# Patient Record
Sex: Female | Born: 1985 | Race: Black or African American | Hispanic: No | Marital: Married | Smoking: Never smoker
Health system: Southern US, Community
[De-identification: ages and names within clinical notes are randomized; demographics above are authoritative.]

## PROBLEM LIST (undated history)

## (undated) ENCOUNTER — Inpatient Hospital Stay (HOSPITAL_COMMUNITY): Payer: Self-pay

## (undated) DIAGNOSIS — J45909 Unspecified asthma, uncomplicated: Secondary | ICD-10-CM

## (undated) HISTORY — PX: CHOLECYSTECTOMY: SHX55

---
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
## 2005-03-16 ENCOUNTER — Emergency Department (HOSPITAL_COMMUNITY): Admission: EM | Admit: 2005-03-16 | Discharge: 2005-03-16 | Payer: Self-pay | Admitting: Emergency Medicine

## 2005-04-29 ENCOUNTER — Emergency Department (HOSPITAL_COMMUNITY): Admission: EM | Admit: 2005-04-29 | Discharge: 2005-04-30 | Payer: Self-pay | Admitting: Emergency Medicine

## 2005-07-20 ENCOUNTER — Emergency Department (HOSPITAL_COMMUNITY): Admission: EM | Admit: 2005-07-20 | Discharge: 2005-07-21 | Payer: Self-pay | Admitting: Emergency Medicine

## 2005-08-06 ENCOUNTER — Emergency Department (HOSPITAL_COMMUNITY): Admission: EM | Admit: 2005-08-06 | Discharge: 2005-08-06 | Payer: Self-pay | Admitting: Emergency Medicine

## 2005-09-05 ENCOUNTER — Emergency Department (HOSPITAL_COMMUNITY): Admission: EM | Admit: 2005-09-05 | Discharge: 2005-09-05 | Payer: Self-pay | Admitting: Emergency Medicine

## 2006-05-21 ENCOUNTER — Emergency Department (HOSPITAL_COMMUNITY): Admission: EM | Admit: 2006-05-21 | Discharge: 2006-05-21 | Payer: Self-pay | Admitting: Emergency Medicine

## 2006-08-05 ENCOUNTER — Emergency Department (HOSPITAL_COMMUNITY): Admission: EM | Admit: 2006-08-05 | Discharge: 2006-08-05 | Payer: Self-pay | Admitting: Emergency Medicine

## 2007-03-03 ENCOUNTER — Emergency Department (HOSPITAL_COMMUNITY): Admission: EM | Admit: 2007-03-03 | Discharge: 2007-03-03 | Payer: Self-pay | Admitting: Emergency Medicine

## 2007-09-02 ENCOUNTER — Emergency Department (HOSPITAL_COMMUNITY): Admission: EM | Admit: 2007-09-02 | Discharge: 2007-09-03 | Payer: Self-pay | Admitting: Emergency Medicine

## 2008-02-16 ENCOUNTER — Emergency Department (HOSPITAL_COMMUNITY): Admission: EM | Admit: 2008-02-16 | Discharge: 2008-02-16 | Payer: Self-pay | Admitting: Emergency Medicine

## 2008-08-29 ENCOUNTER — Emergency Department (HOSPITAL_COMMUNITY): Admission: EM | Admit: 2008-08-29 | Discharge: 2008-08-30 | Payer: Self-pay | Admitting: Emergency Medicine

## 2009-04-16 ENCOUNTER — Emergency Department (HOSPITAL_COMMUNITY): Admission: EM | Admit: 2009-04-16 | Discharge: 2009-04-16 | Payer: Self-pay | Admitting: Emergency Medicine

## 2009-04-23 ENCOUNTER — Emergency Department (HOSPITAL_COMMUNITY): Admission: EM | Admit: 2009-04-23 | Discharge: 2009-04-24 | Payer: Self-pay | Admitting: Emergency Medicine

## 2009-07-24 ENCOUNTER — Emergency Department (HOSPITAL_COMMUNITY): Admission: EM | Admit: 2009-07-24 | Discharge: 2009-07-24 | Payer: Self-pay | Admitting: Emergency Medicine

## 2009-07-25 ENCOUNTER — Emergency Department (HOSPITAL_COMMUNITY): Admission: EM | Admit: 2009-07-25 | Discharge: 2009-07-25 | Payer: Self-pay | Admitting: Emergency Medicine

## 2009-09-03 ENCOUNTER — Emergency Department (HOSPITAL_COMMUNITY): Admission: EM | Admit: 2009-09-03 | Discharge: 2009-09-03 | Payer: Self-pay | Admitting: Emergency Medicine

## 2010-09-04 LAB — URINALYSIS, ROUTINE W REFLEX MICROSCOPIC
Bilirubin Urine: NEGATIVE
Hgb urine dipstick: NEGATIVE
Ketones, ur: NEGATIVE mg/dL
Nitrite: NEGATIVE
Specific Gravity, Urine: 1.014 (ref 1.005–1.030)
Urobilinogen, UA: 1 mg/dL (ref 0.0–1.0)

## 2010-09-04 LAB — DIFFERENTIAL
Basophils Absolute: 0 10*3/uL (ref 0.0–0.1)
Basophils Relative: 0 % (ref 0–1)
Eosinophils Absolute: 0.4 10*3/uL (ref 0.0–0.7)
Lymphocytes Relative: 37 % (ref 12–46)
Lymphs Abs: 3.6 10*3/uL (ref 0.7–4.0)
Neutro Abs: 4.9 10*3/uL (ref 1.7–7.7)
Neutrophils Relative %: 50 % (ref 43–77)

## 2010-09-04 LAB — BASIC METABOLIC PANEL
CO2: 27 mEq/L (ref 19–32)
Creatinine, Ser: 0.72 mg/dL (ref 0.4–1.2)
GFR calc Af Amer: >60 mL/min (ref >60)
Glucose, Bld: 92 mg/dL (ref 70–99)
Potassium: 3.4 mEq/L — ABNORMAL LOW (ref 3.5–5.1)
Sodium: 135 mEq/L (ref 135–145)

## 2010-09-04 LAB — CBC
HCT: 35.2 % — ABNORMAL LOW (ref 36.0–46.0)
MCHC: 33.9 g/dL (ref 30.0–36.0)

## 2010-09-05 LAB — DIFFERENTIAL
Basophils Absolute: 0 10*3/uL (ref 0.0–0.1)
Eosinophils Relative: 4 % (ref 0–5)
Monocytes Relative: 7 % (ref 3–12)
Neutrophils Relative %: 57 % (ref 43–77)

## 2010-09-05 LAB — CBC
Hemoglobin: 12.1 g/dL (ref 12.0–15.0)
MCHC: 33.8 g/dL (ref 30.0–36.0)
Platelets: 339 10*3/uL (ref 150–400)
RBC: 4.02 MIL/uL (ref 3.87–5.11)
RDW: 13 % (ref 11.5–15.5)

## 2010-09-08 LAB — DIFFERENTIAL
Basophils Relative: 1 % (ref 0–1)
Eosinophils Absolute: 0.3 10*3/uL (ref 0.0–0.7)
Lymphocytes Relative: 22 % (ref 12–46)
Monocytes Absolute: 0.8 10*3/uL (ref 0.1–1.0)
Monocytes Relative: 9 % (ref 3–12)
Neutrophils Relative %: 65 % (ref 43–77)

## 2010-09-08 LAB — CBC: HCT: 39.6 % (ref 36.0–46.0)

## 2010-09-08 LAB — URINALYSIS, ROUTINE W REFLEX MICROSCOPIC
Glucose, UA: NEGATIVE mg/dL
Ketones, ur: NEGATIVE mg/dL
Protein, ur: NEGATIVE mg/dL
Specific Gravity, Urine: 1.019 (ref 1.005–1.030)
pH: 6 (ref 5.0–8.0)

## 2010-09-08 LAB — COMPREHENSIVE METABOLIC PANEL
ALT: 31 U/L (ref 0–35)
Albumin: 3.9 g/dL (ref 3.5–5.2)
Alkaline Phosphatase: 93 U/L (ref 39–117)
BUN: 3 mg/dL — ABNORMAL LOW (ref 6–23)
Chloride: 103 mEq/L (ref 96–112)
GFR calc Af Amer: >60 mL/min (ref >60)
GFR calc non Af Amer: >60 mL/min (ref >60)
Glucose, Bld: 83 mg/dL (ref 70–99)
Potassium: 3.9 mEq/L (ref 3.5–5.1)
Total Protein: 7.7 g/dL (ref 6.0–8.3)

## 2010-09-08 LAB — URINE MICROSCOPIC-ADD ON

## 2010-09-08 LAB — PREGNANCY, URINE: Preg Test, Ur: POSITIVE

## 2010-09-18 LAB — DIFFERENTIAL
Eosinophils Absolute: 0.2 10*3/uL (ref 0.0–0.7)
Lymphocytes Relative: 35 % (ref 12–46)
Lymphs Abs: 3.5 10*3/uL (ref 0.7–4.0)
Neutro Abs: 5.6 10*3/uL (ref 1.7–7.7)
Neutrophils Relative %: 56 % (ref 43–77)

## 2010-09-18 LAB — WET PREP, GENITAL
Clue Cells Wet Prep HPF POC: NONE SEEN
Trich, Wet Prep: NONE SEEN
Yeast Wet Prep HPF POC: NONE SEEN

## 2010-09-18 LAB — CBC
Platelets: 383 10*3/uL (ref 150–400)
RBC: 3.98 MIL/uL (ref 3.87–5.11)
WBC: 9.9 10*3/uL (ref 4.0–10.5)

## 2010-09-18 LAB — URINE MICROSCOPIC-ADD ON

## 2010-09-18 LAB — URINALYSIS, ROUTINE W REFLEX MICROSCOPIC
Glucose, UA: NEGATIVE mg/dL
Leukocytes, UA: NEGATIVE
Protein, ur: NEGATIVE mg/dL
Urobilinogen, UA: 1 mg/dL (ref 0.0–1.0)

## 2010-09-18 LAB — BASIC METABOLIC PANEL
BUN: 5 mg/dL — ABNORMAL LOW (ref 6–23)
CO2: 25 mEq/L (ref 19–32)
Calcium: 9.4 mg/dL (ref 8.4–10.5)
GFR calc non Af Amer: >60 mL/min (ref >60)
Glucose, Bld: 100 mg/dL — ABNORMAL HIGH (ref 70–99)

## 2010-09-18 LAB — GC/CHLAMYDIA PROBE AMP, GENITAL: GC Probe Amp, Genital: NEGATIVE

## 2011-02-01 IMAGING — US US TRANSVAGINAL NON-OB
1 series · 13 of 25 positions shown · non-contrast
Comparison: CT abdomen and pelvis 03/03/2007.

CLINICAL DATA: 23-year-old female with pelvic pain on the right
with vomiting.

TRANSABDOMINAL AND TRANSVAGINAL ULTRASOUND OF PELVIS
DOPPLER ULTRASOUND OF OVARIES
TECHNIQUE: Both transabdominal and transvaginal ultrasound
examinations of the pelvis were performed including evaluation of
the uterus, ovaries, adnexal regions, and pelvic cul-de-sac. Color
and duplex Doppler ultrasound was utilized to evaluate blood flow
to the ovaries.

[Series 1: us transvaginal non-ob · 0.28mm/px · 13 of 40 slices shown]
[im 1/40]
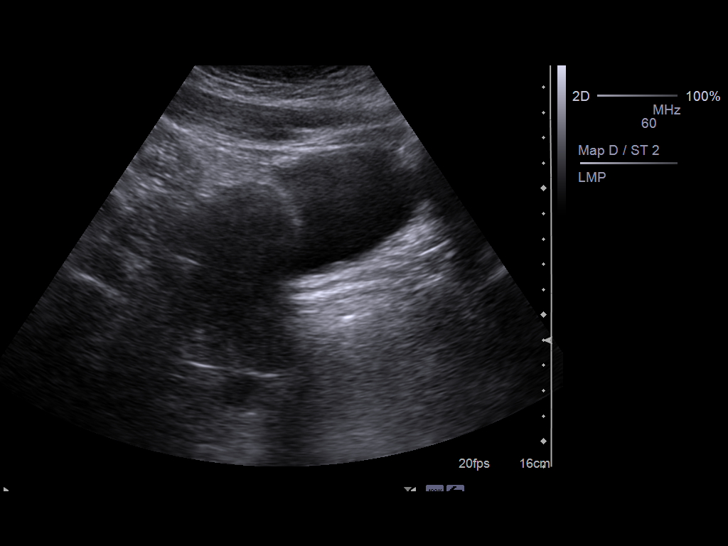
[im 4/40]
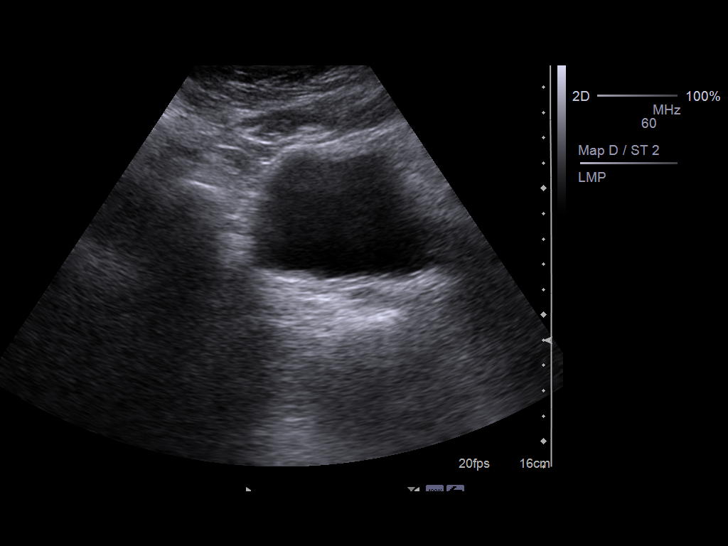
[im 7/40]
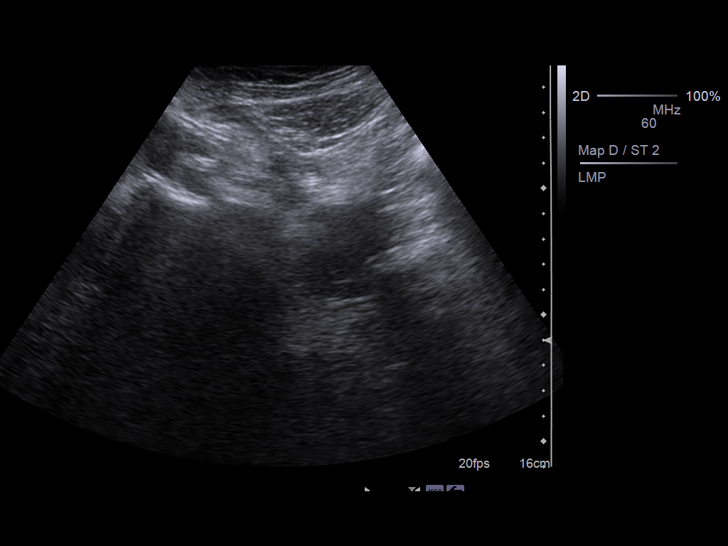
[im 10/40]
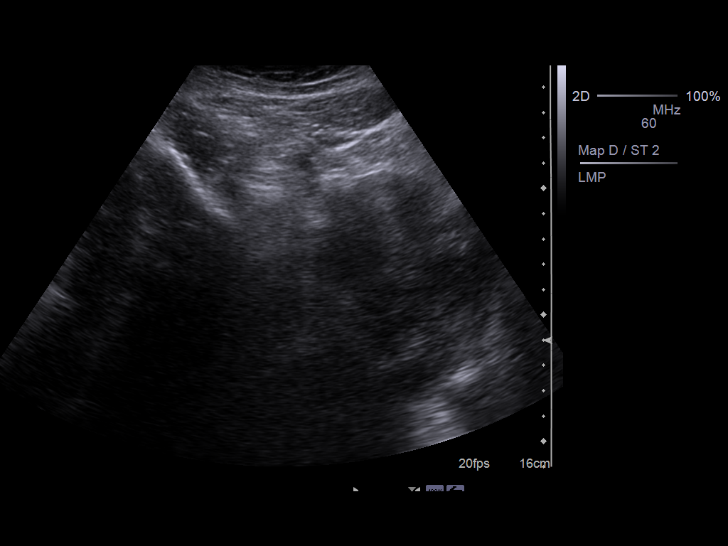
[im 14/40]
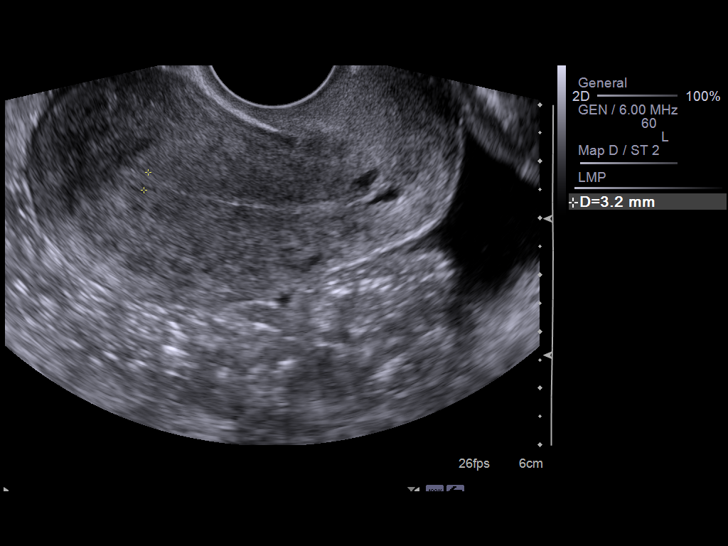
[im 17/40]
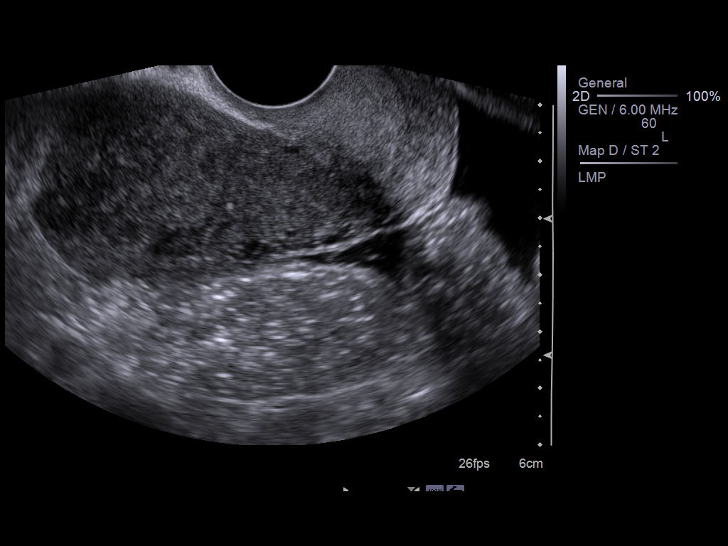
[im 20/40]
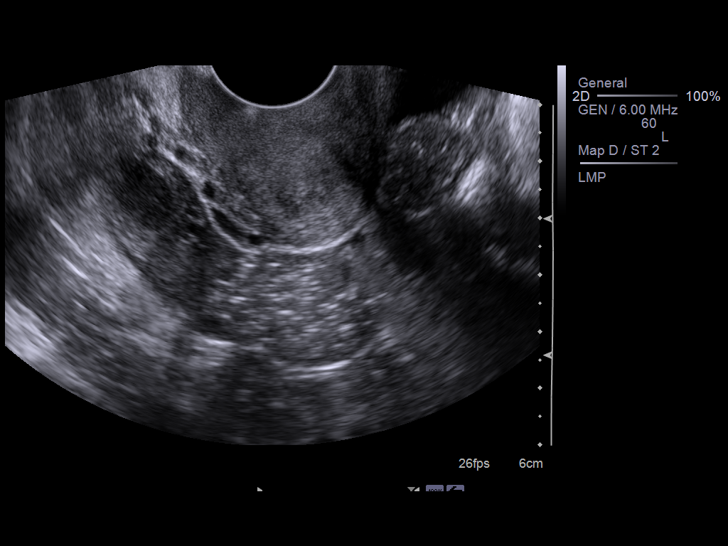
[im 23/40]
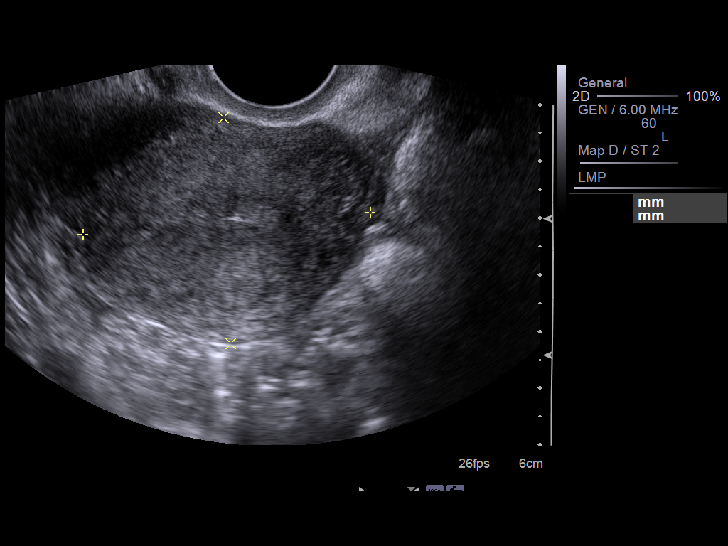
[im 27/40]
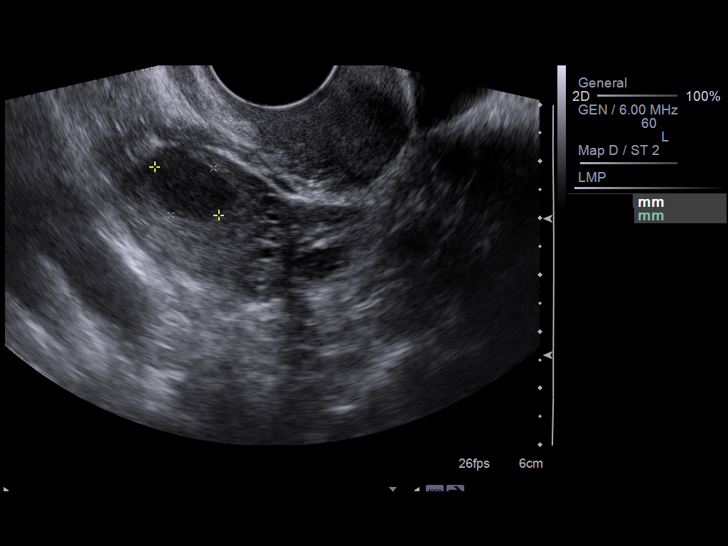
[im 30/40]
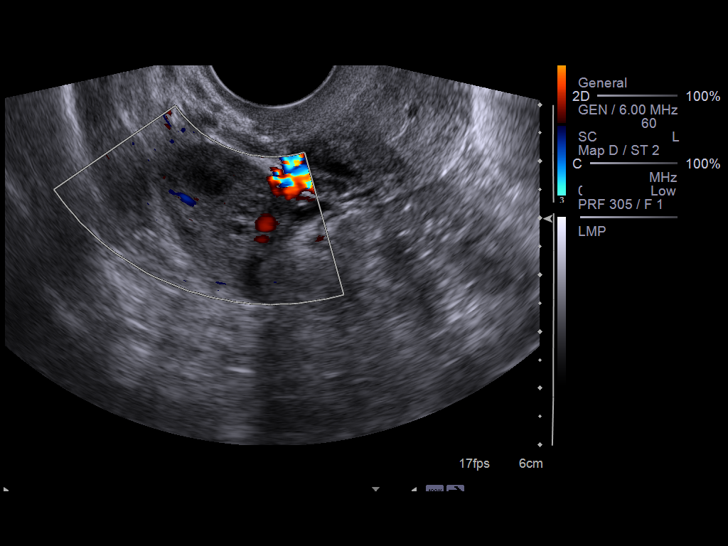
[im 33/40]
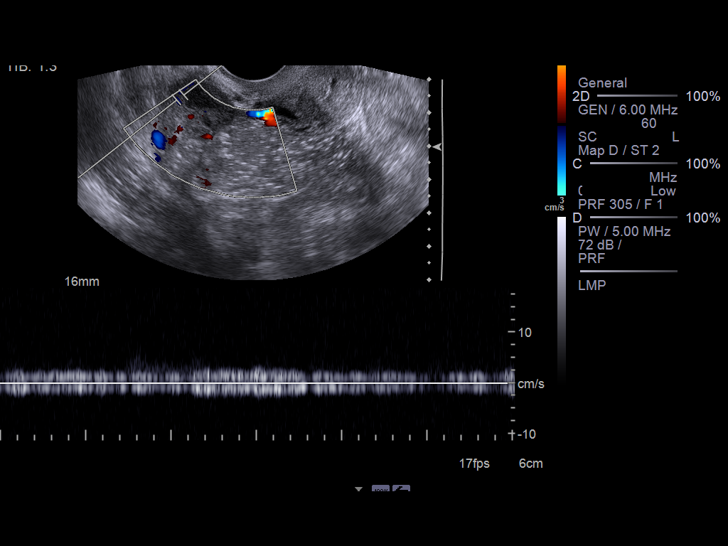
[im 36/40]
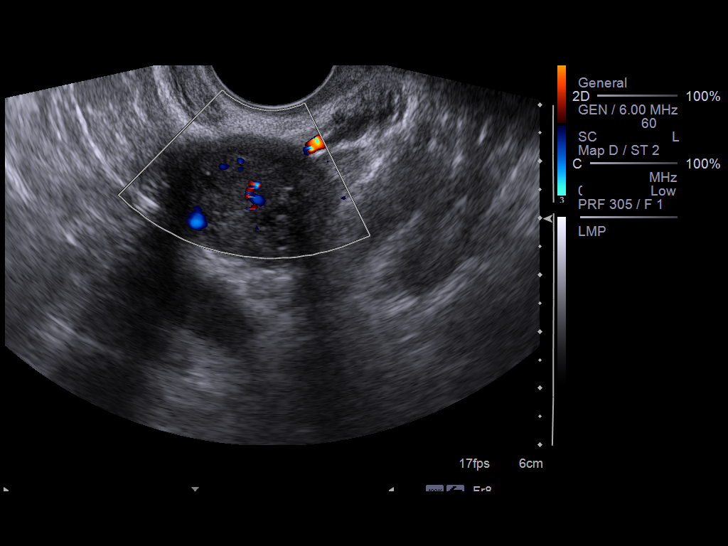
[im 40/40]
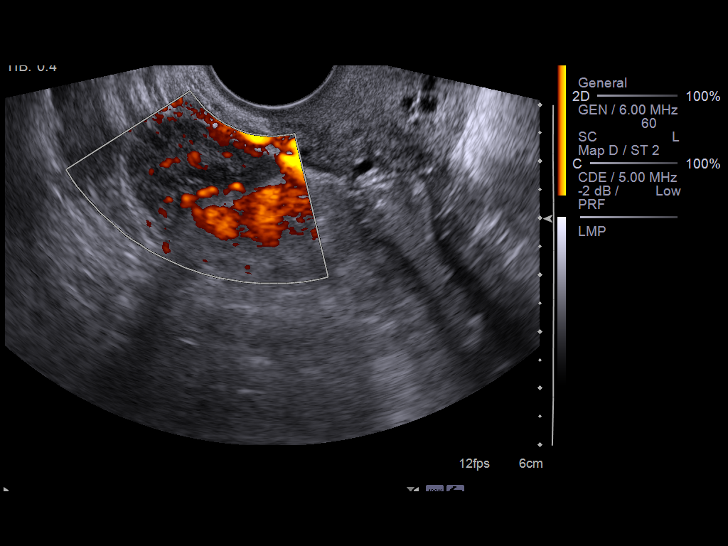

[13 of 25 positions shown; findings below may reference images not displayed]

FINDINGS: Uterus is within normal limits measuring is within normal limits
measuring 7.8 x 4.0 x 5.1 cm.

Endometrium 3 mm and is within normal limits.

Right Ovary is remarkable for a 14 mm hypoechoic lesion .  Overall
right adnexal size is 3.8 x 2.3 x 1.2 cm.  Arterial and venous flow
is demonstrated with spectral Doppler.

Left Ovary is within normal limits measuring 3.0 x 2.3 x 2.1 cm.
There is arterial and venous flow demonstrated with spectral
Doppler.

Other Findings:  Moderate volume of simple appearing pelvic free
fluid.
IMPRESSION: 1.  No evidence of ovarian torsion.
2.  14 mm hypoechoic lesion in the right ovary.  Favor physiologic
cyst.  Recommend correlation with pregnancy test.
3.  Moderate volume of simple appearing pelvic free fluid.

## 2011-03-10 LAB — CBC
HCT: 39.3
Hemoglobin: 13.4
MCHC: 34
MCV: 90
Platelets: 387
RBC: 4.36
RDW: 12.6
WBC: 10.2

## 2011-03-10 LAB — BASIC METABOLIC PANEL WITH GFR
Chloride: 105
Creatinine, Ser: 0.77
GFR calc Af Amer: >60
GFR calc non Af Amer: >60
Potassium: 3.8

## 2011-03-10 LAB — URINALYSIS, ROUTINE W REFLEX MICROSCOPIC
Bilirubin Urine: NEGATIVE
Glucose, UA: NEGATIVE
Ketones, ur: NEGATIVE
Nitrite: NEGATIVE
Protein, ur: NEGATIVE
Specific Gravity, Urine: 1.019
Urobilinogen, UA: 1
pH: 6.5

## 2011-03-10 LAB — URINE MICROSCOPIC-ADD ON

## 2011-03-10 LAB — BASIC METABOLIC PANEL
BUN: 4 — ABNORMAL LOW
CO2: 23
Calcium: 9.5
Glucose, Bld: 108 — ABNORMAL HIGH
Sodium: 137

## 2011-03-10 LAB — DIFFERENTIAL
Basophils Absolute: 0
Basophils Relative: 0
Eosinophils Absolute: 0.2
Eosinophils Relative: 2
Lymphocytes Relative: 30
Lymphs Abs: 3.1
Monocytes Absolute: 0.4
Monocytes Relative: 4
Neutro Abs: 6.5
Neutrophils Relative %: 63

## 2011-03-10 LAB — OCCULT BLOOD X 1 CARD TO LAB, STOOL: Fecal Occult Bld: NEGATIVE

## 2011-03-10 LAB — PREGNANCY, URINE: Preg Test, Ur: NEGATIVE

## 2011-03-19 LAB — URINALYSIS, ROUTINE W REFLEX MICROSCOPIC
Ketones, ur: NEGATIVE
Nitrite: NEGATIVE
Protein, ur: NEGATIVE

## 2011-03-19 LAB — POCT PREGNANCY, URINE: Preg Test, Ur: NEGATIVE

## 2011-03-19 LAB — RAPID STREP SCREEN (MED CTR MEBANE ONLY): Streptococcus, Group A Screen (Direct): NEGATIVE

## 2011-03-19 LAB — URINE MICROSCOPIC-ADD ON

## 2011-03-27 LAB — CBC
HCT: 39.5
Hemoglobin: 13.6
MCHC: 34.5
MCV: 88.5
Platelets: 403 — ABNORMAL HIGH
RBC: 4.46
RDW: 12.8
WBC: 9.1

## 2011-03-27 LAB — URINALYSIS, ROUTINE W REFLEX MICROSCOPIC
Bilirubin Urine: NEGATIVE
Nitrite: NEGATIVE
Specific Gravity, Urine: 1.01
pH: 7.5

## 2011-03-27 LAB — URINE MICROSCOPIC-ADD ON

## 2011-03-27 LAB — DIFFERENTIAL
Basophils Absolute: 0.1
Basophils Relative: 1
Eosinophils Absolute: 0.3
Eosinophils Relative: 3
Lymphocytes Relative: 37
Lymphs Abs: 3.4 — ABNORMAL HIGH
Monocytes Absolute: 0.6
Monocytes Relative: 6
Neutro Abs: 4.8
Neutrophils Relative %: 53

## 2011-03-27 LAB — WET PREP, GENITAL
Trich, Wet Prep: NONE SEEN
Yeast Wet Prep HPF POC: NONE SEEN

## 2011-03-27 LAB — I-STAT 8, (EC8 V) (CONVERTED LAB)
Acid-base deficit: 2
BUN: 6
Bicarbonate: 24.3 — ABNORMAL HIGH
Chloride: 107
Glucose, Bld: 135 — ABNORMAL HIGH
HCT: 45
Hemoglobin: 15.3 — ABNORMAL HIGH
Operator id: 234501
Potassium: 3.6
Sodium: 143
TCO2: 26
pCO2, Ven: 48.3
pH, Ven: 7.311 — ABNORMAL HIGH

## 2011-03-27 LAB — GC/CHLAMYDIA PROBE AMP, GENITAL
Chlamydia, DNA Probe: NEGATIVE
GC Probe Amp, Genital: NEGATIVE

## 2011-03-27 LAB — SYPHILIS: RPR W/REFLEX TO RPR TITER AND TREPONEMAL ANTIBODIES, TRADITIONAL SCREENING AND DIAGNOSIS ALGORITHM: RPR Ser Ql: NONREACTIVE

## 2011-03-27 LAB — POCT I-STAT CREATININE
Creatinine, Ser: 0.9
Operator id: 234501

## 2013-05-18 ENCOUNTER — Emergency Department (HOSPITAL_COMMUNITY)
Admission: EM | Admit: 2013-05-18 | Discharge: 2013-05-18 | Disposition: A | Discharge status: Home / Self Care | Payer: Self-pay | Attending: Emergency Medicine | Admitting: Emergency Medicine

## 2013-05-18 DIAGNOSIS — J040 Acute laryngitis: Secondary | ICD-10-CM | POA: Insufficient documentation

## 2013-05-18 DIAGNOSIS — Z880 Allergy status to penicillin: Secondary | ICD-10-CM | POA: Insufficient documentation

## 2013-05-18 DIAGNOSIS — J9801 Acute bronchospasm: Secondary | ICD-10-CM

## 2013-05-18 DIAGNOSIS — Z79899 Other long term (current) drug therapy: Secondary | ICD-10-CM | POA: Insufficient documentation

## 2013-05-18 DIAGNOSIS — M2550 Pain in unspecified joint: Secondary | ICD-10-CM | POA: Insufficient documentation

## 2013-05-18 DIAGNOSIS — J029 Acute pharyngitis, unspecified: Secondary | ICD-10-CM | POA: Insufficient documentation

## 2013-05-18 DIAGNOSIS — J45901 Unspecified asthma with (acute) exacerbation: Secondary | ICD-10-CM | POA: Insufficient documentation

## 2013-05-18 DIAGNOSIS — IMO0001 Reserved for inherently not codable concepts without codable children: Secondary | ICD-10-CM | POA: Insufficient documentation

## 2013-05-18 LAB — RAPID STREP SCREEN (MED CTR MEBANE ONLY): Streptococcus, Group A Screen (Direct): NEGATIVE

## 2013-05-18 MED ORDER — HYDROCOD POLST-CHLORPHEN POLST 10-8 MG/5ML PO LQCR: 5.0000 mL | Freq: Two times a day (BID) | ORAL | Status: DC | PRN

## 2013-05-18 MED ORDER — ALBUTEROL SULFATE (5 MG/ML) 0.5% IN NEBU
5.0000 mg | INHALATION_SOLUTION | Freq: Once | RESPIRATORY_TRACT | Status: AC
  Administered 2013-05-18: 5 mg via RESPIRATORY_TRACT
  Filled 2013-05-18: qty 1

## 2013-05-18 MED ORDER — PREDNISONE 20 MG PO TABS
60.0000 mg | ORAL_TABLET | Freq: Once | ORAL | Status: AC
  Administered 2013-05-18: 60 mg via ORAL
  Filled 2013-05-18: qty 3

## 2013-05-18 NOTE — ED Notes (Signed)
Patient having sore throat last several days rapid strep was obtain by PA

## 2013-05-18 NOTE — ED Provider Notes (Signed)
CSN: [id number]     Arrival date & time 05/18/13  4098 History   First MD Initiated Contact with Patient 05/18/13 [phone]     Chief Complaint  Patient presents with  . Sore Throat   (Consider location/radiation/quality/duration/timing/severity/associated sxs/prior Treatment) HPI Comments: Pt is a 27 y/o female with a PMHx of asthma who presents to the ED complaining of sore throat and generalized body aches x 1 day and intermittent both dry and productive cough with green and blood-tinged sputum x 2 weeks. She gets a pain in the center of her chest when she coughs. She began to lose her voice yesterday, has been slightly wheezing. Denies fever. Tried OTC cold medications and inhaler without relief. Her daughter is sick with strep throat.   The history is provided by the patient.    No past medical history on file. No past surgical history on file. No family history on file. History  Substance Use Topics  . Smoking status: Not on file  . Smokeless tobacco: Not on file  . Alcohol Use: Not on file   OB History   No data available     Review of Systems  HENT: Positive for sore throat and voice change.   Respiratory: Positive for cough, chest tightness and wheezing.   Musculoskeletal: Positive for arthralgias and myalgias.  All other systems reviewed and are negative.    Allergies  Penicillins and Tomato  Home Medications   Current Outpatient Rx  Name  Route  Sig  Dispense  Refill  . albuterol (PROVENTIL HFA;VENTOLIN HFA) 108 (90 BASE) MCG/ACT inhaler   Inhalation   Inhale 1-2 puffs into the lungs every 6 (six) hours as needed for wheezing or shortness of breath.         . chlorpheniramine-HYDROcodone (TUSSIONEX PENNKINETIC ER) 10-8 MG/5ML LQCR   Oral   Take 5 mLs by mouth every 12 (twelve) hours as needed for cough.   115 mL   0    BP 129/87  Pulse 69  SpO2 100% Physical Exam  Nursing note and vitals reviewed. Constitutional: She is oriented to person, place, and  time. She appears well-developed and well-nourished. No distress.  HENT:  Head: Normocephalic and atraumatic.  Mouth/Throat: Uvula is midline and mucous membranes are normal. Posterior oropharyngeal erythema present. No oropharyngeal exudate or posterior oropharyngeal edema.  Hoarse voice.  Eyes: Conjunctivae are normal.  Neck: Normal range of motion. Neck supple.  Cardiovascular: Normal rate, regular rhythm and normal heart sounds.   Pulmonary/Chest: Effort normal. No respiratory distress. She has no decreased breath sounds. She has wheezes (mild scattered end-expiratory). She has no rhonchi. She has no rales.  Abdominal: Soft. Bowel sounds are normal. There is no tenderness.  Musculoskeletal: Normal range of motion. She exhibits no edema.  Lymphadenopathy:    She has cervical adenopathy.  Neurological: She is alert and oriented to person, place, and time.  Skin: Skin is warm and dry. She is not diaphoretic.  Psychiatric: She has a normal mood and affect. Her behavior is normal.    ED Course  Procedures (including critical care time) Labs Review Labs Reviewed  RAPID STREP SCREEN  CULTURE, GROUP A STREP   Imaging Review No results found.  EKG Interpretation   None       MDM   1. Bronchospasm   2. Pharyngitis   3. Laryngitis    Patient presenting with sore throat, cough and wheezing. Albuterol nebulizer treatment given, patient reports some improvement of her symptoms, clinical improvement  noted on reexamination. Still with some mild expiratory wheezes. 60 mg prednisone given. Rapid strep negative. Afebrile, normal vital signs. Will discharge with Tussionex for cough. She has inhalers at home. Return precautions given. Patient states understanding of plan and is agreeable.   Trevor Mace, PA-C 05/18/13 [phone]

## 2013-05-20 LAB — CULTURE, GROUP A STREP

## 2013-05-21 NOTE — ED Provider Notes (Signed)
Medical screening examination/treatment/procedure(s) were performed by non-physician practitioner and as supervising physician I was immediately available for consultation/collaboration.   Chanel Mckesson, MD 05/21/13 0558 

## 2013-05-23 ENCOUNTER — Emergency Department (HOSPITAL_COMMUNITY)
Admission: EM | Admit: 2013-05-23 | Discharge: 2013-05-23 | Disposition: A | Discharge status: Home / Self Care | Payer: Self-pay | Attending: Emergency Medicine | Admitting: Emergency Medicine

## 2013-05-23 ENCOUNTER — Encounter (HOSPITAL_COMMUNITY): Payer: Self-pay | Admitting: Emergency Medicine

## 2013-05-23 DIAGNOSIS — Z79899 Other long term (current) drug therapy: Secondary | ICD-10-CM | POA: Insufficient documentation

## 2013-05-23 DIAGNOSIS — R1110 Vomiting, unspecified: Secondary | ICD-10-CM | POA: Insufficient documentation

## 2013-05-23 DIAGNOSIS — J40 Bronchitis, not specified as acute or chronic: Secondary | ICD-10-CM

## 2013-05-23 DIAGNOSIS — J029 Acute pharyngitis, unspecified: Secondary | ICD-10-CM | POA: Insufficient documentation

## 2013-05-23 DIAGNOSIS — J209 Acute bronchitis, unspecified: Secondary | ICD-10-CM | POA: Insufficient documentation

## 2013-05-23 DIAGNOSIS — J45901 Unspecified asthma with (acute) exacerbation: Secondary | ICD-10-CM | POA: Insufficient documentation

## 2013-05-23 DIAGNOSIS — R42 Dizziness and giddiness: Secondary | ICD-10-CM | POA: Insufficient documentation

## 2013-05-23 DIAGNOSIS — Z880 Allergy status to penicillin: Secondary | ICD-10-CM | POA: Insufficient documentation

## 2013-05-23 HISTORY — DX: Unspecified asthma, uncomplicated: J45.909

## 2013-05-23 MED ORDER — AZITHROMYCIN 250 MG PO TABS
500.0000 mg | ORAL_TABLET | Freq: Once | ORAL | Status: AC
  Administered 2013-05-23: 500 mg via ORAL
  Filled 2013-05-23: qty 2

## 2013-05-23 MED ORDER — AZITHROMYCIN 250 MG PO TABS: 250.0000 mg | ORAL_TABLET | Freq: Every day | ORAL | Status: DC

## 2013-05-23 MED ORDER — ALBUTEROL SULFATE (5 MG/ML) 0.5% IN NEBU
5.0000 mg | INHALATION_SOLUTION | Freq: Once | RESPIRATORY_TRACT | Status: AC
  Administered 2013-05-23: 5 mg via RESPIRATORY_TRACT
  Filled 2013-05-23: qty 1

## 2013-05-23 MED ORDER — ALBUTEROL SULFATE HFA 108 (90 BASE) MCG/ACT IN AERS
2.0000 | INHALATION_SPRAY | RESPIRATORY_TRACT | Status: DC | PRN
  Administered 2013-05-23: 2 via RESPIRATORY_TRACT
  Filled 2013-05-23: qty 6.7

## 2013-05-23 MED ORDER — HYDROCOD POLST-CHLORPHEN POLST 10-8 MG/5ML PO LQCR: 5.0000 mL | Freq: Two times a day (BID) | ORAL | Status: DC | PRN

## 2013-05-23 NOTE — ED Notes (Signed)
Pt does have some wheezing on auscultation

## 2013-05-23 NOTE — ED Notes (Addendum)
Pt states she has been having fever, cough, and sore throat since last week. Pt was seen here for the same last Wednesday. Pt states she has greenish colored sputum when she coughs.

## 2013-05-23 NOTE — ED Provider Notes (Signed)
CSN: [id number]     Arrival date & time 05/23/13  1229 History   First MD Initiated Contact with Patient 05/23/13 1415     Chief Complaint  Patient presents with  . Sore Throat  . Cough  . Dizziness  . Emesis  . Fever   (Consider location/radiation/quality/duration/timing/severity/associated sxs/prior Treatment) Patient is a 27 y.o. female presenting with pharyngitis, cough, vomiting, and fever. The history is provided by the patient. No language interpreter was used.  Sore Throat This is a new problem. The current episode started in the past 7 days. Associated symptoms include congestion, coughing, a fever, a sore throat and vomiting. Pertinent negatives include no abdominal pain or nausea. Associated symptoms comments: She complains of a sore throat, chest congestion and and chest tightness. She has had a subjective fever. No nausea, vomiting or abdominal pain. She reports she has been exposed to a family member with strep throat. She was seen in the emergency department 5 days ago with similar symptoms and presents today for persistent, not worsening symptoms. .  Cough Associated symptoms: fever, shortness of breath and sore throat   Emesis Associated symptoms: sore throat   Associated symptoms: no abdominal pain   Fever Associated symptoms: congestion, cough, sore throat and vomiting   Associated symptoms: no nausea     Past Medical History  Diagnosis Date  . Asthma    Past Surgical History  Procedure Laterality Date  . Cholecystectomy     No family history on file. History  Substance Use Topics  . Smoking status: Never Smoker   . Smokeless tobacco: Not on file  . Alcohol Use: No   OB History   Grav Para Term Preterm Abortions TAB SAB Ect Mult Living                 Review of Systems  Constitutional: Positive for fever.  HENT: Positive for congestion, sore throat and voice change. Negative for trouble swallowing.   Respiratory: Positive for cough, chest tightness  and shortness of breath.   Cardiovascular: Negative for leg swelling.  Gastrointestinal: Positive for vomiting. Negative for nausea and abdominal pain.  Genitourinary: Negative.   Neurological: Positive for dizziness.    Allergies  Penicillins and Tomato  Home Medications   Current Outpatient Rx  Name  Route  Sig  Dispense  Refill  . albuterol (PROVENTIL HFA;VENTOLIN HFA) 108 (90 BASE) MCG/ACT inhaler   Inhalation   Inhale 1-2 puffs into the lungs every 6 (six) hours as needed for wheezing or shortness of breath.         . chlorpheniramine-HYDROcodone (TUSSIONEX PENNKINETIC ER) 10-8 MG/5ML LQCR   Oral   Take 5 mLs by mouth every 12 (twelve) hours as needed for cough.   115 mL   0    BP 121/66  Pulse 107  Temp(Src) 98 F (36.7 C) (Oral)  Resp 20  Wt 257 lb (116.574 kg)  SpO2 97%  LMP 05/17/2013 Physical Exam  Constitutional: She is oriented to person, place, and time. She appears well-developed and well-nourished.  HENT:  Head: Normocephalic.  Nose: Right sinus exhibits no frontal sinus tenderness. Left sinus exhibits no frontal sinus tenderness.  Mouth/Throat: Uvula is midline. Mucous membranes are not dry. Posterior oropharyngeal erythema present.  Eyes: Conjunctivae are normal.  Neck: Normal range of motion. Neck supple.  Cardiovascular: Normal rate and regular rhythm.   Pulmonary/Chest: Effort normal and breath sounds normal. She has no wheezes. She has no rales.  Abdominal: Soft. Bowel sounds  are normal. There is no tenderness. There is no rebound and no guarding.  Musculoskeletal: Normal range of motion.  Neurological: She is alert and oriented to person, place, and time.  Skin: Skin is warm and dry. No rash noted.  Psychiatric: She has a normal mood and affect.    ED Course  Procedures (including critical care time) Labs Review Labs Reviewed - No data to display Imaging Review No results found.  EKG Interpretation   None       MDM  No diagnosis  found. 1. Bronchitis  Will treat with Zithromax secondary to duration of symptoms, repeated ER visits. She is stable here, reports her chest tightness is like her asthma for which she would use an inhaler if she had one. Minimal risk factors for PE with symptoms to explain very mild tachycardia.     Arnoldo Hooker, PA-C 05/23/13 1521

## 2013-05-23 NOTE — ED Notes (Signed)
Patient's O2 Sat remained at 99% while ambulating

## 2013-05-23 NOTE — ED Notes (Signed)
Pt is here with complaints of sore throat, fever, difficulty breathing, vomiting, and feeling like she is going to pass out

## 2013-05-27 NOTE — ED Provider Notes (Signed)
Medical screening examination/treatment/procedure(s) were performed by non-physician practitioner and as supervising physician I was immediately available for consultation/collaboration.   Date: 05/27/2013  Rate: 102  Rhythm: sinus tachycardia  QRS Axis: normal  Intervals: normal  ST/T Wave abnormalities: normal  Conduction Disutrbances:none  Narrative Interpretation:   Old EKG Reviewed: none available       Gwyneth Sprout, MD 05/27/13 [phone]

## 2013-06-17 ENCOUNTER — Encounter (HOSPITAL_COMMUNITY): Payer: Self-pay | Admitting: Emergency Medicine

## 2013-06-17 ENCOUNTER — Emergency Department (HOSPITAL_COMMUNITY)
Admission: EM | Admit: 2013-06-17 | Discharge: 2013-06-17 | Disposition: A | Discharge status: Home / Self Care | Payer: Medicaid Other | Source: Home / Self Care

## 2013-06-17 ENCOUNTER — Emergency Department (HOSPITAL_COMMUNITY)
Admission: EM | Admit: 2013-06-17 | Discharge: 2013-06-17 | Disposition: A | Discharge status: Home / Self Care | Payer: Medicaid Other | Attending: Emergency Medicine | Admitting: Emergency Medicine

## 2013-06-17 DIAGNOSIS — Z79899 Other long term (current) drug therapy: Secondary | ICD-10-CM | POA: Insufficient documentation

## 2013-06-17 DIAGNOSIS — Z880 Allergy status to penicillin: Secondary | ICD-10-CM | POA: Insufficient documentation

## 2013-06-17 DIAGNOSIS — Z008 Encounter for other general examination: Secondary | ICD-10-CM | POA: Insufficient documentation

## 2013-06-17 DIAGNOSIS — R42 Dizziness and giddiness: Secondary | ICD-10-CM | POA: Insufficient documentation

## 2013-06-17 DIAGNOSIS — R5383 Other fatigue: Secondary | ICD-10-CM

## 2013-06-17 DIAGNOSIS — Z8742 Personal history of other diseases of the female genital tract: Secondary | ICD-10-CM | POA: Insufficient documentation

## 2013-06-17 DIAGNOSIS — R110 Nausea: Secondary | ICD-10-CM | POA: Insufficient documentation

## 2013-06-17 DIAGNOSIS — Z3490 Encounter for supervision of normal pregnancy, unspecified, unspecified trimester: Secondary | ICD-10-CM

## 2013-06-17 DIAGNOSIS — J45909 Unspecified asthma, uncomplicated: Secondary | ICD-10-CM | POA: Insufficient documentation

## 2013-06-17 DIAGNOSIS — R5381 Other malaise: Secondary | ICD-10-CM | POA: Insufficient documentation

## 2013-06-17 DIAGNOSIS — Z3201 Encounter for pregnancy test, result positive: Secondary | ICD-10-CM | POA: Insufficient documentation

## 2013-06-17 DIAGNOSIS — Z0000 Encounter for general adult medical examination without abnormal findings: Secondary | ICD-10-CM

## 2013-06-17 LAB — POCT PREGNANCY, URINE: Preg Test, Ur: NEGATIVE

## 2013-06-17 LAB — HCG, QUANTITATIVE, PREGNANCY: hCG, Beta Chain, Quant, S: 44 m[IU]/mL — ABNORMAL HIGH (ref <5)

## 2013-06-17 MED ORDER — ONDANSETRON HCL 4 MG PO TABS: 4.0000 mg | ORAL_TABLET | Freq: Four times a day (QID) | ORAL | Status: DC

## 2013-06-17 MED ORDER — PRENATAL COMPLETE 14-0.4 MG PO TABS: 1.0000 | ORAL_TABLET | Freq: Once | ORAL | Status: DC

## 2013-06-17 NOTE — ED Notes (Signed)
The pt is here for a preg test.  She took a home test and reports that it was pos. lmp dec8 and her period has not come on yet.  Feeling tired otherwise she has no symptoms

## 2013-06-17 NOTE — Discharge Instructions (Signed)
[facility] this coming week to get a urine pregnancy and Beta hCG level performed again Please rest and stay hydrated Please take prenatal vitamins as prescribed Please avoid any physical or strenuous activity Please continue monitor symptoms and if symptoms are to worsen or change (fever greater than 101, chills, nausea, vomiting, chills, abdominal pain, abdominal cramping, bleeding, worsening pain) please report back to the ED immediately  Pregnancy If you are planning on getting pregnant, it is a good idea to make a preconception appointment with your caregiver to discuss having a healthy lifestyle before getting pregnant. This includes diet, weight, exercise, taking prenatal vitamins (especially folic acid, which helps prevent brain and spinal cord defects), avoiding alcohol, smoking and illegal drugs, medical problems (diabetes, convulsions), family history of genetic problems, working conditions, and immunizations. It is better to have knowledge of these things and do something about them before getting pregnant. During your pregnancy, it is important to follow certain guidelines in order to have a healthy baby. It is very important to get good prenatal care and follow your caregiver's instructions. Prenatal care includes all the medical care you receive before your baby's birth. This helps to prevent problems during the pregnancy and childbirth. HOME CARE INSTRUCTIONS   Start your prenatal visits by the 12th week of pregnancy or earlier, if possible. At first, appointments are usually scheduled monthly. They become more frequent in the last 2 months before delivery. It is important that you keep your caregiver's appointments and follow your caregiver's instructions regarding medication use, exercise, and diet.  During pregnancy, you are providing food for you and your baby. Eat a regular, well-balanced diet. Choose foods such as meat,  fish, milk and other dairy products, vegetables, fruits, whole-grain breads and cereals. Your caregiver will inform you of the ideal weight gain depending on your current height and weight. Drink lots of liquids. Try to drink 8 glasses of water a day.  Alcohol is associated with a number of birth defects including fetal alcohol syndrome. It is best to avoid alcohol completely. Smoking will cause low birth rate and prematurity. Use of alcohol and nicotine during your pregnancy also increases the chances that your child will be chemically dependent later in their life and may contribute to SIDS (Sudden Infant Death Syndrome).  Do not use illegal drugs.  Only take prescription or over-the-counter medications that are recommended by your caregiver. Other medications can cause genetic and physical problems in the baby.  Morning sickness can often be helped by keeping soda crackers at the bedside. Eat a few before getting up in the morning.  A sexual relationship may be continued until near the end of pregnancy if there are no other problems such as early (premature) leaking of amniotic fluid from the membranes, vaginal bleeding, painful intercourse or belly (abdominal) pain.  Exercise regularly. Check with your caregiver if you are unsure of the safety of some of your exercises.  Do not use hot tubs, steam rooms or saunas. These increase the risk of fainting and hurting yourself and the baby. Swimming is OK for exercise. Get plenty of rest, including afternoon naps when possible, especially in the third trimester.  Avoid toxic odors and chemicals.  Do not wear high heels. They may cause you to lose your balance and fall.  Do not lift over 5 pounds. If you do lift anything, lift with your legs and thighs, not your back.  Avoid long trips, especially in the third trimester.  If you have to travel out of the city or state, take a copy of your medical records with you. SEEK IMMEDIATE MEDICAL CARE IF:    You develop an unexplained oral temperature above 102 F (38.9 C), or as your caregiver suggests.  You have leaking of fluid from the vagina. If leaking membranes are suspected, take your temperature and inform your caregiver of this when you call.  There is vaginal spotting or bleeding. Notify your caregiver of the amount and how many pads are used.  You continue to feel sick to your stomach (nauseous) and have no relief from remedies suggested, or you throw up (vomit) blood or coffee ground like materials.  You develop upper abdominal pain.  You have round ligament discomfort in the lower abdominal area. This still must be evaluated by your caregiver.  You feel contractions of the uterus.  You do not feel the baby move, or there is less movement than before.  You have painful urination.  You have abnormal vaginal discharge.  You have persistent diarrhea.  You get a severe headache.  You have problems with your vision.  You develop muscle weakness.  You feel dizzy and faint.  You develop shortness of breath.  You develop chest pain.  You have back pain that travels down to your leg and feet.  You feel irregular or a very fast heartbeat.  You develop excessive weight gain in a short period of time (5 pounds in 3 to 5 days).  You are involved in a domestic violence situation. Document Released: 06/02/2005 Document Revised: 12/02/2011 Document Reviewed: 11/24/2008 [facility] Patient Information 2014 [place], [place].   Emergency Department Resource Guide 1) Find a Doctor and Pay Out of Pocket Although you won't have to find out who is covered by your insurance plan, it is a good idea to ask around and get recommendations. You will then need to call the office and see if the doctor you have chosen will accept you as a new patient and what types of options they offer for patients who are self-pay. Some doctors offer discounts or will set up payment plans for their  patients who do not have insurance, but you will need to ask so you aren't surprised when you get to your appointment.  2) Contact Your Local Health Department Not all health departments have doctors that can see patients for sick visits, but many do, so it is worth a call to see if yours does. If you don't know where your local health department is, you can check in your phone book. The CDC also has a tool to help you locate your state's health department, and many state websites also have listings of all of their local health departments.  3) Find a Walk-in Clinic If your illness is not likely to be very severe or complicated, you may want to try a walk in clinic. These are popping up all over the country in pharmacies, drugstores, and shopping centers. They're usually staffed by nurse practitioners or physician assistants that have been trained to treat common illnesses and complaints. They're usually fairly quick and inexpensive. However, if you have serious medical issues or chronic medical problems, these are probably not your best option.  No Primary Care Doctor: - Call Health Connect at  [phone] - they can help you locate a primary care doctor that  accepts your insurance, provides certain services, etc. - Physician Referral Service- [phone]  Chronic Pain Problems: Organization  Address  Phone   Notes  [facility]  [phone] Patients need to be referred by their primary care doctor.   Medication Assistance: Organization         Address  Phone   Notes  [facility] Medication [facility] [address] [place], [place] [address] [phone] --Must be a resident of [facility] -- Must have NO insurance coverage whatsoever (no Medicaid/ Medicare, etc.) -- The pt. MUST have a primary care doctor that directs their care regularly and follows them in the community   [facility]  [phone]   [employer]  [phone]     Agencies that provide inexpensive medical care: Organization         Address  Phone   Notes  [place]  [phone]   [facility]    [phone]   [facility] [place], [place] [address] [phone]   [place] [address], [place] [phone]   Planned Parenthood    [phone]   [facility]    [phone]   [place] and [place] [address], [place] Phone:  [phone], Fax:  [phone] Hours of Operation:  9 am - 6 pm, M-F.  Also accepts Medicaid/Medicare and self-pay.  [facility] for [place], [address], [place] Phone: [phone], Fax: [phone]. Hours of Operation:  8:30 am - 5:30 pm, M-F.  Also accepts Medicaid and self-pay.  [facility] [place] [address] [place] Phone: [phone]   [place], [place], [place] [phone], Ext. [phone] Mondays & Thursdays: 7-9 AM.  First 15 patients are seen on a first come, first serve basis.    [place] Providers:  Organization         Address  Phone   Notes  [facility] [address], [place] [phone] Also accepts self-pay patients.  [facility] [address], [place]  [phone]   [place], [address], [place] [phone]   [facility] [address] [place] [phone]   Lucianne Lei [address], [place]   [phone] Only accepts [place] Access [place] patients after they have their name applied to their card.   Self-Pay (no insurance) in [facility]:  Organization         Address  Phone   Notes  Sickle Cell Patients, [facility] [place] [phone]    [facility] [place] [phone]   [facility] [place]  [place], [place], [place] [phone]   [facility]/Dr. Osei-Bonsu  [address], [place] or [address], [place] [phone] Phone number for both [place] and [place] locations is the same.  [facility] and [facility] [address] [place] [phone]   [facility] [address], [place] or [address] [phone]   [facility] [address] [place] [phone], phone; [phone], fax Sees patients 1st and 3rd Saturday of every month.  Must not qualify  for public or private insurance (i.e. Medicaid, Medicare, [place] Health Choice, Veterans' Benefits)  Household income should be no more than 200% of the poverty level The clinic cannot treat you if you are pregnant or think you are pregnant  Sexually transmitted diseases are not treated at the clinic.    Dental Care: Organization         Address  Phone  Notes  [facility] Department of [facility] [place] [phone] Accepts children up to age 62 who are enrolled in [place] or [place]; pregnant women with a Medicaid card; and children who have applied for Medicaid or [place] Health Choice, but were declined, whose parents can pay a reduced fee at time of service.  [facility] Department of [facility]  [address], [place] [phone] Accepts children up to age 51 who are enrolled in [place] or [place]; pregnant women with a Medicaid card; and children who have applied for Medicaid or [place] Health Choice, but were declined, whose parents can pay a reduced fee at time of service.  [place] [facility] [place] Adult Dental Access PROGRAM  [place] [phone] Patients are seen by  appointment only. Walk-ins are not accepted. [place] will see patients 63 years of age and older. Monday - Tuesday (8am-5pm) Most Wednesdays (8:30-5pm) $30 per visit, cash only  [facility] Adult Dental Access PROGRAM  [address], [facility] [phone] Patients are seen by appointment only. Walk-ins are not accepted. [place] will see patients 36 years of age and older. One Wednesday Evening (Monthly: Volunteer Based).  $30 per visit, cash only  [place]  [phone] for adults; Children under age 45, call Graduate Pediatric Dentistry at [phone]. Children aged 58-14, please call [phone] to request a pediatric application.  Dental services are provided in all areas of dental care including fillings, crowns and bridges, complete and partial dentures, implants, gum treatment, root canals, and extractions. Preventive care is also provided. Treatment is provided to both adults and children. Patients are selected via a lottery and there is often a waiting list.   [facility] [address], [place]  [phone] [email or web address]   [facility] [address] [place], [place] [phone], Ext. [phone] Second and Fourth Thursday of each month, opens at 6:30 AM; Clinic ends at 9 AM.  Patients are seen on a first-come first-served basis, and a limited number are seen during each clinic.   [facility]  [address] [place] Danker [place], [place] [phone]   Eligibility Requirements You must have lived in [place], [place], or [place] counties for at least the last three months.   You cannot be eligible for state or federal sponsored [employer], including [employer], [place], or [employer].   You generally cannot be eligible for healthcare insurance through your employer.    How to apply: Eligibility screenings are held every Tuesday and Wednesday afternoon from 1:00 pm until 4:00 pm. You  do not need an appointment for the interview!  [facility] [address] [place], [place]   [place] Department  [place]  [phone]    Behavioral Health Resources in the Community: Intensive Outpatient Programs Organization         Address  Phone  Notes  °[facility] [address], [place], [place] [phone]   °[facility] [address], [place], [place] [phone]   °[facility] [address], [place], [place] ° [phone]   °[facility] [address],  °[place], [place] 1-800-853-5163 or 336-641-4981   °Substance Abuse Resources °Organization         Address  Phone  Notes  °[facility]  336-882-2125   °[facility]  [phone]   °[facility]  336-285-9073   °[facility]  [phone]   °Residential & Outpatient Substance Abuse Program  1-800-659-3381   °Psychological Services °Organization         Address  Phone  Notes  °[facility]  336- [phone]   °[facility]  336- 378-7881   °[facility] [address], [place] 1-800-853-5163 or 336-641-4981   ° °Mobile Crisis Teams °Organization         Address  Phone  Notes  °[facility], Mobile Crisis Care Unit  1-877-626-1772   °Assertive °[facility]otherapeutic Services ° [address] [place], [place] 336-834-9664   °Sharon DeEsch [address] °[place] 336-554-5454   ° °Self-Help/Support Groups °Organization         Address  Phone             Notes  °[facility] [place] - variety of support groups  336- 373-1402 Call for more information  °Narcotics Anonymous (NA), [facility] [address], °[place]  2 meetings at this location  ° °Residential Treatment Programs °Organization          Address  Phone  Notes  °[facility] [address],    °[place]  1-866-801-8205   °[facility] ° [address], [place], [place] [phone]   °[facility] [address], [place] [phone] Admissions: 8am-3pm M-F  °[facility] [address]    °[place], [place] 336-841-1104   °[facility] [address], [place], [place] 336-379-7146   °[facility] [address]  °[place], [place] 336-285-9073   °[facility] - Intensive Outpatient [address], [place], [place] 336-852-3033   °[facility] ([facility] [address]  °[place], [place] 1-877-615-2722 or 336-784-9470   °[facility] ([facility]) [address] [place], [place] 336-227-7417 Accepts Medicaid  °[facility] [address]  °[place] 1-800-659-3381 Substance Abuse/Addiction Treatment  ° °[place] Behavioral Health Resources °Organization         Address  Phone  Notes  °[facility]  (888) 581-9988   °Julie Brannon, PhD [address] [place], [place]   (336) 349-5553 or (336) 951-0000   °[facility]   [address] °[place], [place] (336) 349-4454   °[facility] [address], [place], [place] [employer] (336) 342-8316 Insurance/Medicaid/sponsorship through [facility]  °[facility] [address]                                    [place],  (336) 342-8316 Therapy/tele-psych/case  °[facility] [address]  ° [place],  (336) 349-2233    °Dr. Arfeen  (336) 349-4544   °[facility] [place]  [employer] [place]   [facility] 1) [address], [place] 2) [place] 3)  [place] [address], [place] [phone]  [phone]   [place] ([phone] or [phone] (After Hours)

## 2013-06-17 NOTE — ED Provider Notes (Signed)
CSN: [id number]     Arrival date & time 06/17/13  1531 History  This chart was scribed for Raymon Mutton, PA-C, working with Gerhard Munch, MD, by Ardelia Mems ED Scribe. This patient was seen in room TR08C/TR08C and the patient's care was started at 4:53 PM.   Chief Complaint  Patient presents with  . wants a preg test     The history is provided by the patient. No language interpreter was used.    HPI Comments: Jenna Bonilla is a 28 y.o. female who presents to the Emergency Department requesting a pregnancy test. She states that she took a positive pregnancy test at home last night. She states that her LMP was December 8th, and she is concerned that her next menstrual period did not begin this week, as she expected. She states that she has regular periods at baseline, and has never missed a period. She states that she has had intermittent light-headedness, fatigue and "morning sickness" over the past 5 days. She states that she has a history of a miscarriage last year. She denies chest pain, SOB or difficulty breathing, vaginal discharge or bleeding, abdominal pain or any other symptoms.   Past Medical History  Diagnosis Date  . Asthma    Past Surgical History  Procedure Laterality Date  . Cholecystectomy     No family history on file. History  Substance Use Topics  . Smoking status: Never Smoker   . Smokeless tobacco: Not on file  . Alcohol Use: No   OB History   Grav Para Term Preterm Abortions TAB SAB Ect Mult Living                 Review of Systems  Constitutional: Positive for fatigue.  Respiratory: Negative for shortness of breath.   Cardiovascular: Negative for chest pain.  Gastrointestinal: Positive for nausea. Negative for abdominal pain.  Genitourinary: Negative for vaginal bleeding and vaginal discharge.  Neurological: Positive for light-headedness.  All other systems reviewed and are negative.   Allergies  Penicillins and Tomato  Home  Medications   Current Outpatient Rx  Name  Route  Sig  Dispense  Refill  . albuterol (PROVENTIL HFA;VENTOLIN HFA) 108 (90 BASE) MCG/ACT inhaler   Inhalation   Inhale 2 puffs into the lungs 2 (two) times daily as needed for wheezing or shortness of breath.          . ondansetron (ZOFRAN) 4 MG tablet   Oral   Take 1 tablet (4 mg total) by mouth every 6 (six) hours.   12 tablet   0   . Prenatal Vit-Fe Fumarate-FA (PRENATAL COMPLETE) 14-0.4 MG TABS   Oral   Take 1 tablet by mouth once.   60 each   0    Triage Vitals: BP 126/80  Pulse 105  Temp(Src) 99.1 F (37.3 C) (Oral)  Resp 20  Wt 265 lb 7 oz (120.402 kg)  SpO2 100%  LMP 05/17/2013  Physical Exam  Nursing note and vitals reviewed. Constitutional: She is oriented to person, place, and time. She appears well-developed and well-nourished. No distress.  HENT:  Head: Normocephalic and atraumatic.  Mouth/Throat: Oropharynx is clear and moist. No oropharyngeal exudate.  Eyes: Conjunctivae and EOM are normal. Pupils are equal, round, and reactive to light. Right eye exhibits no discharge. Left eye exhibits no discharge.  Neck: Normal range of motion. Neck supple.  Cardiovascular: Normal rate, regular rhythm and normal heart sounds.   Pulses:      Radial pulses  are 2+ on the right side, and 2+ on the left side.  Pulmonary/Chest: Effort normal and breath sounds normal. No respiratory distress. She has no wheezes. She has no rales.  Abdominal: Soft. Bowel sounds are normal. There is no tenderness. There is no guarding.  Musculoskeletal: Normal range of motion.  Full ROM to upper and lower extremities without difficulty noted, negative ataxia noted  Neurological: She is alert and oriented to person, place, and time. She exhibits normal muscle tone. Coordination normal.  Skin: Skin is warm and dry. No rash noted. She is not diaphoretic. No erythema.  Psychiatric: She has a normal mood and affect. Her behavior is normal. Thought  content normal.    ED Course  Procedures (including critical care time)  DIAGNOSTIC STUDIES: Oxygen Saturation is 100% on RA, normal by my interpretation.    COORDINATION OF CARE: 4:58 PM- Discussed negative urine pregnancy test results in the ED. Will obtain an hCG pregnancy test. Pt advised of plan for treatment and pt agrees.  Results for orders placed during the hospital encounter of 06/17/13  HCG, QUANTITATIVE, PREGNANCY      Result Value Range   hCG, Beta Chain, Quant, S 44 (*) <5 mIU/mL  POCT PREGNANCY, URINE      Result Value Range   Preg Test, Ur NEGATIVE  NEGATIVE    Labs Review Labs Reviewed  HCG, QUANTITATIVE, PREGNANCY - Abnormal; Notable for the following:    hCG, Beta Chain, Quant, S 44 (*)    All other components within normal limits  POCT PREGNANCY, URINE   Imaging Review No results found.  EKG Interpretation   None       MDM   1. Normal physical exam   2. Pregnancy     Filed Vitals:   06/17/13 1541 06/17/13 1921  BP: 126/80 109/80  Pulse: 105 95  Temp: 99.1 F (37.3 C) 99 F (37.2 C)  TempSrc: Oral Oral  Resp: 20 20  Weight: 265 lb 7 oz (120.402 kg)   SpO2: 100% 99%   I personally performed the services described in this documentation, which was scribed in my presence. The recorded information has been reviewed and is accurate.  Patient presenting to emergency apartment with request for urine pregnancy test secondary to resident superior being on 05/23/2013. Patient reports she normally has regular periods. Patient reports she took her urine pregnancy test last night while at home and it came back positive. Patient requesting blood test as well. Denied abdominal cramping, vaginal bleeding, vaginal discharge, dysuria. Alert and oriented. GCS 15. Heart rate and rhythm normal. Lungs clear to auscultation to upper lower lobes. Pulses palpable and strong, radial 2+ bilaterally. Bowel sounds normoactive in all 4 quadrants-soft, nontender upon  palpation. Full range of motion to upper and lower extremities bilaterally without difficulty noted-negative ataxia. Urine pregnancy negative. Beta-hCG mildly elevated at 44. Patient stable, afebrile. Patient in early stages of pregnancy. Recommended patient to follow appointments next week for repeat beta-hCG and urine pregnancy to see there is to change to the beta-hCG level. Discussed with patient to rest and stay hydrated. Recommended prenatal vitamins to be started. Discussed with patient to closely monitor symptoms and if symptoms are to worsen or change to report back to the ED - strict return instructions given.  Patient agreed to plan of care, understood, all questions answered.    Raymon MuttonMarissa Sofie Schendel, PA-C 06/18/13 [phone]

## 2013-06-19 NOTE — ED Provider Notes (Signed)
  I personally performed the services described in this documentation, which was scribed in my presence. The recorded information has been reviewed and is accurate.     Zilpha Mcandrew, MD 06/19/13 0002 

## 2013-07-01 ENCOUNTER — Emergency Department (HOSPITAL_COMMUNITY): Payer: BC Managed Care – PPO

## 2013-07-01 ENCOUNTER — Emergency Department (HOSPITAL_COMMUNITY)
Admission: EM | Admit: 2013-07-01 | Discharge: 2013-07-02 | Disposition: A | Discharge status: Home / Self Care | Payer: BC Managed Care – PPO | Attending: Emergency Medicine | Admitting: Emergency Medicine

## 2013-07-01 ENCOUNTER — Encounter (HOSPITAL_COMMUNITY): Payer: Self-pay | Admitting: Emergency Medicine

## 2013-07-01 DIAGNOSIS — O219 Vomiting of pregnancy, unspecified: Secondary | ICD-10-CM | POA: Insufficient documentation

## 2013-07-01 DIAGNOSIS — B9789 Other viral agents as the cause of diseases classified elsewhere: Secondary | ICD-10-CM | POA: Insufficient documentation

## 2013-07-01 DIAGNOSIS — B349 Viral infection, unspecified: Secondary | ICD-10-CM

## 2013-07-01 DIAGNOSIS — O9989 Other specified diseases and conditions complicating pregnancy, childbirth and the puerperium: Secondary | ICD-10-CM | POA: Insufficient documentation

## 2013-07-01 DIAGNOSIS — Z79899 Other long term (current) drug therapy: Secondary | ICD-10-CM | POA: Insufficient documentation

## 2013-07-01 DIAGNOSIS — J45901 Unspecified asthma with (acute) exacerbation: Secondary | ICD-10-CM | POA: Insufficient documentation

## 2013-07-01 DIAGNOSIS — Z880 Allergy status to penicillin: Secondary | ICD-10-CM | POA: Insufficient documentation

## 2013-07-01 LAB — COMPREHENSIVE METABOLIC PANEL
ALT: 43 U/L — AB (ref 0–35)
AST: 48 U/L — AB (ref 0–37)
Albumin: 3.7 g/dL (ref 3.5–5.2)
Alkaline Phosphatase: 113 U/L (ref 39–117)
BILIRUBIN TOTAL: 0.5 mg/dL (ref 0.3–1.2)
BUN: 7 mg/dL (ref 6–23)
CHLORIDE: 100 meq/L (ref 96–112)
CO2: 23 meq/L (ref 19–32)
Calcium: 9.6 mg/dL (ref 8.4–10.5)
Creatinine, Ser: 0.86 mg/dL (ref 0.50–1.10)
GFR calc Af Amer: >90 mL/min (ref >90)
GLUCOSE: 176 mg/dL — AB (ref 70–99)
Potassium: 4.2 mEq/L (ref 3.7–5.3)
SODIUM: 137 meq/L (ref 137–147)
Total Protein: 7.8 g/dL (ref 6.0–8.3)

## 2013-07-01 LAB — CBC WITH DIFFERENTIAL/PLATELET
BASOS ABS: 0 10*3/uL (ref 0.0–0.1)
Basophils Relative: 0 % (ref 0–1)
Eosinophils Absolute: 0.1 10*3/uL (ref 0.0–0.7)
Eosinophils Relative: 1 % (ref 0–5)
HEMATOCRIT: 36.1 % (ref 36.0–46.0)
Hemoglobin: 12.5 g/dL (ref 12.0–15.0)
LYMPHS PCT: 26 % (ref 12–46)
Lymphs Abs: 1.8 10*3/uL (ref 0.7–4.0)
MCH: 30.9 pg (ref 26.0–34.0)
MCHC: 34.6 g/dL (ref 30.0–36.0)
MCV: 89.1 fL (ref 78.0–100.0)
MONO ABS: 0.6 10*3/uL (ref 0.1–1.0)
Monocytes Relative: 9 % (ref 3–12)
NEUTROS ABS: 4.3 10*3/uL (ref 1.7–7.7)
Neutrophils Relative %: 63 % (ref 43–77)
PLATELETS: 312 10*3/uL (ref 150–400)
RBC: 4.05 MIL/uL (ref 3.87–5.11)
RDW: 13.1 % (ref 11.5–15.5)
WBC: 6.8 10*3/uL (ref 4.0–10.5)

## 2013-07-01 MED ORDER — SODIUM CHLORIDE 0.9 % IV BOLUS (SEPSIS)
1000.0000 mL | Freq: Once | INTRAVENOUS | Status: AC
  Administered 2013-07-01: 1000 mL via INTRAVENOUS

## 2013-07-01 MED ORDER — ACETAMINOPHEN 325 MG PO TABS
650.0000 mg | ORAL_TABLET | Freq: Once | ORAL | Status: AC
Start: 2013-07-01 — End: 2013-07-02
  Administered 2013-07-02: 650 mg via ORAL
  Filled 2013-07-01: qty 2

## 2013-07-01 NOTE — ED Notes (Signed)
PT to ED c/o headache, cough and body aches since today.  Pt is vomiting as well, but she is also [redacted] weeks pregnant.

## 2013-07-01 NOTE — ED Provider Notes (Signed)
CSN: [id number]     Arrival date & time 07/01/13  1805 History   First MD Initiated Contact with Patient 07/01/13 2125     Chief Complaint  Patient presents with  . flu-like symptoms    (Consider location/radiation/quality/duration/timing/severity/associated sxs/prior Treatment) HPI Comments: Patient is a 28 year old female with a past medical history of asthma who is about [redacted] weeks pregnant who presents with a 1 day history of headache, cough, sore throat and body aches. Symptoms started gradually and progressively worsened since the onset. The headache is aching and moderate in severity. The cough is hacking and non productive. Patient has not tried anything for symptoms due to her pregnancy and "doesn't want to do anything that will hurt the baby." No aggravating/alleviating factors. No known sick contacts.    Past Medical History  Diagnosis Date  . Asthma    Past Surgical History  Procedure Laterality Date  . Cholecystectomy     No family history on file. History  Substance Use Topics  . Smoking status: Never Smoker   . Smokeless tobacco: Not on file  . Alcohol Use: No   OB History   Grav Para Term Preterm Abortions TAB SAB Ect Mult Living   1              Review of Systems  Constitutional: Negative for fever, chills and fatigue.  HENT: Negative for trouble swallowing.   Eyes: Negative for visual disturbance.  Respiratory: Positive for cough. Negative for shortness of breath.   Cardiovascular: Negative for chest pain and palpitations.  Gastrointestinal: Positive for vomiting. Negative for nausea, abdominal pain and diarrhea.  Genitourinary: Negative for dysuria and difficulty urinating.  Musculoskeletal: Positive for myalgias. Negative for arthralgias and neck pain.  Skin: Negative for color change.  Neurological: Positive for headaches. Negative for dizziness and weakness.  Psychiatric/Behavioral: Negative for dysphoric mood.    Allergies  Penicillins and  Tomato  Home Medications   Current Outpatient Rx  Name  Route  Sig  Dispense  Refill  . albuterol (PROVENTIL HFA;VENTOLIN HFA) 108 (90 BASE) MCG/ACT inhaler   Inhalation   Inhale 2 puffs into the lungs 2 (two) times daily as needed for wheezing or shortness of breath.          . ondansetron (ZOFRAN) 4 MG tablet   Oral   Take 4 mg by mouth daily as needed for nausea or vomiting.         . Prenatal Vit-Fe Fumarate-FA (PRENATAL COMPLETE) 14-0.4 MG TABS   Oral   Take 1 tablet by mouth daily.          BP 111/75  Pulse 95  Temp(Src) 99.4 F (37.4 C) (Oral)  Resp 20  Wt 263 lb 9 oz (119.551 kg)  SpO2 99%  LMP 05/23/2013 Physical Exam  Nursing note and vitals reviewed. Constitutional: She is oriented to person, place, and time. She appears well-developed and well-nourished. No distress.  HENT:  Head: Normocephalic and atraumatic.  Mouth/Throat: Oropharynx is clear and moist. No oropharyngeal exudate.  Eyes: Conjunctivae and EOM are normal.  Neck: Normal range of motion.  Cardiovascular: Normal rate and regular rhythm.  Exam reveals no gallop and no friction rub.   No murmur heard. Pulmonary/Chest: Effort normal. She has wheezes. She has no rales. She exhibits no tenderness.  Expiratory wheezes noted in upper right lung field.   Abdominal: Soft. She exhibits no distension. There is no tenderness. There is no rebound and no guarding.  Musculoskeletal: Normal  range of motion.  Neurological: She is alert and oriented to person, place, and time. Coordination normal.  Speech is goal-oriented. Moves limbs without ataxia.   Skin: Skin is warm and dry.  Psychiatric: She has a normal mood and affect. Her behavior is normal.    ED Course  Procedures (including critical care time) Labs Review Labs Reviewed  COMPREHENSIVE METABOLIC PANEL - Abnormal; Notable for the following:    Glucose, Bld 176 (*)    AST 48 (*)    ALT 43 (*)    All other components within normal limits  CBC  WITH DIFFERENTIAL   Imaging Review Dg Chest 2 View  07/01/2013   CLINICAL DATA:  Persistent nonproductive cough, sneezing, headache and nasal congestion. History of asthma.  EXAM: CHEST  2 VIEW  COMPARISON:  Chest radiograph performed 02/16/2008  FINDINGS: The lungs are well-aerated and clear. There is no evidence of focal opacification, pleural effusion or pneumothorax.  The heart is normal in size; the mediastinal contour is within normal limits. No acute osseous abnormalities are seen. Clips are noted within the right upper quadrant, reflecting prior cholecystectomy.  IMPRESSION: No acute cardiopulmonary process seen.   Electronically Signed   By: Roanna RaiderJeffery  Chang M.D.   On: 07/01/2013 23:49    EKG Interpretation   None       MDM   1. Viral illness     10:43 PM Patient will have chest xray, IV fluids and tylenol. Patient has a low grade temperature and is tachycardic.   12:29 AM Patient reports feeling better. Chest xray unremarkable for acute changes. Vitals stable and patient afebrile. Patient instructed to take tylenol at home and drink plenty of fluids. Patient instructed to return to the ED with worsening or concerning symptoms.     Emilia BeckKaitlyn Makensey Rego, PA-C 07/02/13 0030

## 2013-07-01 NOTE — ED Notes (Signed)
Pt taken to xray 

## 2013-07-02 NOTE — ED Provider Notes (Signed)
History/physical exam/procedure(s) were performed by non-physician practitioner and as supervising physician I was immediately available for consultation/collaboration. I have reviewed all notes and am in agreement with care and plan.   Breyon Sigg S Halle Davlin, MD 07/02/13 1553 

## 2013-07-02 NOTE — Discharge Instructions (Signed)
Take tylenol as needed for body aches and headache. Drink plenty of fluids. Take benadryl as needed. Refer to attached documents for more information. Return to the ED with worsening or concerning symptoms.

## 2013-07-04 ENCOUNTER — Inpatient Hospital Stay (HOSPITAL_COMMUNITY)
Admission: AD | Admit: 2013-07-04 | Discharge: 2013-07-04 | Disposition: A | Discharge status: Home / Self Care | Payer: BC Managed Care – PPO | Source: Ambulatory Visit | Attending: Obstetrics | Admitting: Obstetrics

## 2013-07-04 ENCOUNTER — Encounter (HOSPITAL_COMMUNITY): Payer: Self-pay

## 2013-07-04 ENCOUNTER — Inpatient Hospital Stay (HOSPITAL_COMMUNITY): Payer: BC Managed Care – PPO

## 2013-07-04 DIAGNOSIS — O26859 Spotting complicating pregnancy, unspecified trimester: Secondary | ICD-10-CM

## 2013-07-04 DIAGNOSIS — O2390 Unspecified genitourinary tract infection in pregnancy, unspecified trimester: Secondary | ICD-10-CM | POA: Insufficient documentation

## 2013-07-04 DIAGNOSIS — O4690 Antepartum hemorrhage, unspecified, unspecified trimester: Secondary | ICD-10-CM

## 2013-07-04 DIAGNOSIS — N83209 Unspecified ovarian cyst, unspecified side: Secondary | ICD-10-CM | POA: Insufficient documentation

## 2013-07-04 DIAGNOSIS — B373 Candidiasis of vulva and vagina: Secondary | ICD-10-CM | POA: Insufficient documentation

## 2013-07-04 DIAGNOSIS — Z3490 Encounter for supervision of normal pregnancy, unspecified, unspecified trimester: Secondary | ICD-10-CM

## 2013-07-04 DIAGNOSIS — O209 Hemorrhage in early pregnancy, unspecified: Secondary | ICD-10-CM | POA: Insufficient documentation

## 2013-07-04 DIAGNOSIS — B3731 Acute candidiasis of vulva and vagina: Secondary | ICD-10-CM | POA: Insufficient documentation

## 2013-07-04 DIAGNOSIS — O34599 Maternal care for other abnormalities of gravid uterus, unspecified trimester: Secondary | ICD-10-CM | POA: Insufficient documentation

## 2013-07-04 DIAGNOSIS — N898 Other specified noninflammatory disorders of vagina: Secondary | ICD-10-CM

## 2013-07-04 LAB — URINE MICROSCOPIC-ADD ON

## 2013-07-04 LAB — URINALYSIS, ROUTINE W REFLEX MICROSCOPIC
BILIRUBIN URINE: NEGATIVE
Glucose, UA: NEGATIVE mg/dL
KETONES UR: 15 mg/dL — AB
NITRITE: NEGATIVE
PROTEIN: NEGATIVE mg/dL
SPECIFIC GRAVITY, URINE: 1.02 (ref 1.005–1.030)
Urobilinogen, UA: 1 mg/dL (ref 0.0–1.0)
pH: 7 (ref 5.0–8.0)

## 2013-07-04 LAB — CBC
HEMATOCRIT: 37.7 % (ref 36.0–46.0)
HEMOGLOBIN: 12.7 g/dL (ref 12.0–15.0)
MCH: 29.7 pg (ref 26.0–34.0)
MCHC: 33.7 g/dL (ref 30.0–36.0)
MCV: 88.3 fL (ref 78.0–100.0)
Platelets: 309 10*3/uL (ref 150–400)
RBC: 4.27 MIL/uL (ref 3.87–5.11)
RDW: 13.4 % (ref 11.5–15.5)
WBC: 6.4 10*3/uL (ref 4.0–10.5)

## 2013-07-04 LAB — ABO/RH: ABO/RH(D): A POS

## 2013-07-04 LAB — HCG, QUANTITATIVE, PREGNANCY: HCG, BETA CHAIN, QUANT, S: 19193 m[IU]/mL — AB (ref <5)

## 2013-07-04 LAB — WET PREP, GENITAL
CLUE CELLS WET PREP: NONE SEEN
TRICH WET PREP: NONE SEEN

## 2013-07-04 MED ORDER — FLUCONAZOLE 150 MG PO TABS: 150.0000 mg | ORAL_TABLET | Freq: Every day | ORAL | Status: DC

## 2013-07-04 MED ORDER — FLUCONAZOLE 150 MG PO TABS
150.0000 mg | ORAL_TABLET | Freq: Every day | ORAL | Status: DC
Start: 2013-07-04 — End: 2013-07-04
  Administered 2013-07-04: 150 mg via ORAL
  Filled 2013-07-04: qty 1

## 2013-07-04 NOTE — MAU Note (Signed)
Patient states she had light spotting when wiping x 1 today, no pain,

## 2013-07-04 NOTE — MAU Provider Note (Signed)
History     CSN: [id number]  Arrival date and time: 07/04/13 1342   First Provider Initiated Contact with Patient 07/04/13 1510      Chief Complaint  Patient presents with  . Vaginal Discharge   HPI  Ms. Jenna Bonilla is a 28 y.o. female G1P0 at [redacted]w[redacted]d who presents with vaginal spotting/ abnormal discharge that she first noticed this morning when she went to the bathroom. The discharge was brown/red on her tissue; she just saw it one time. She denies pain or bleeding at this time.  Patient is scheduled to see Dr. Clearance Coots on Jan 27. Denies intercourse recently.   OB History   Grav Para Term Preterm Abortions TAB SAB Ect Mult Living   1               Past Medical History  Diagnosis Date  . Asthma     Past Surgical History  Procedure Laterality Date  . Cholecystectomy      Family History  Problem Relation Age of Onset  . Diabetes Mother   . Cancer Father   . Diabetes Sister   . Diabetes Maternal Aunt   . Cancer Paternal Uncle   . Cancer Paternal Grandmother     History  Substance Use Topics  . Smoking status: Never Smoker   . Smokeless tobacco: Not on file  . Alcohol Use: No    Allergies:  Allergies  Allergen Reactions  . Penicillins Hives and Shortness Of Breath  . Tomato Hives and Shortness Of Breath    Prescriptions prior to admission  Medication Sig Dispense Refill  . albuterol (PROVENTIL HFA;VENTOLIN HFA) 108 (90 BASE) MCG/ACT inhaler Inhale 2 puffs into the lungs 2 (two) times daily as needed for wheezing or shortness of breath.       . ondansetron (ZOFRAN) 4 MG tablet Take 4 mg by mouth daily as needed for nausea or vomiting.      . Prenatal Vit-Fe Fumarate-FA (PRENATAL COMPLETE) 14-0.4 MG TABS Take 1 tablet by mouth daily.       Results for orders placed during the hospital encounter of 07/04/13 (from the past 48 hour(s))  URINALYSIS, ROUTINE W REFLEX MICROSCOPIC     Status: Abnormal   Collection Time    07/04/13  2:10 PM      Result  Value Range   Color, Urine YELLOW  YELLOW   APPearance CLEAR  CLEAR   Specific Gravity, Urine 1.020  1.005 - 1.030   pH 7.0  5.0 - 8.0   Glucose, UA NEGATIVE  NEGATIVE mg/dL   Hgb urine dipstick LARGE (*) NEGATIVE   Bilirubin Urine NEGATIVE  NEGATIVE   Ketones, ur 15 (*) NEGATIVE mg/dL   Protein, ur NEGATIVE  NEGATIVE mg/dL   Urobilinogen, UA 1.0  0.0 - 1.0 mg/dL   Nitrite NEGATIVE  NEGATIVE   Leukocytes, UA TRACE (*) NEGATIVE  URINE MICROSCOPIC-ADD ON     Status: Abnormal   Collection Time    07/04/13  2:10 PM      Result Value Range   Squamous Epithelial / LPF FEW (*) RARE   WBC, UA 3-6  <3 WBC/hpf   RBC / HPF 0-2  <3 RBC/hpf   Bacteria, UA RARE  RARE   Urine-Other FEW YEAST    WET PREP, GENITAL     Status: Abnormal   Collection Time    07/04/13  3:20 PM      Result Value Range   Yeast Wet Prep HPF POC MANY (*)  NONE SEEN   Trich, Wet Prep NONE SEEN  NONE SEEN   Clue Cells Wet Prep HPF POC NONE SEEN  NONE SEEN   WBC, Wet Prep HPF POC FEW (*) NONE SEEN   Comment: MODERATE BACTERIA SEEN  CBC     Status: None   Collection Time    07/04/13  3:24 PM      Result Value Range   WBC 6.4  4.0 - 10.5 K/uL   RBC 4.27  3.87 - 5.11 MIL/uL   Hemoglobin 12.7  12.0 - 15.0 g/dL   HCT 16.137.7  09.636.0 - 04.546.0 %   MCV 88.3  78.0 - 100.0 fL   MCH 29.7  26.0 - 34.0 pg   MCHC 33.7  30.0 - 36.0 g/dL   RDW 40.913.4  81.111.5 - 91.415.5 %   Platelets 309  150 - 400 K/uL  ABO/RH     Status: None   Collection Time    07/04/13  3:24 PM      Result Value Range   ABO/RH(D) A POS    HCG, QUANTITATIVE, PREGNANCY     Status: Abnormal   Collection Time    07/04/13  3:24 PM      Result Value Range   hCG, Beta Chain, Quant, S 19193 (*) <5 mIU/mL   Comment:              GEST. AGE      CONC.  (mIU/mL)       <=1 WEEK        5 - 50         2 WEEKS       50 - 500         3 WEEKS       100 - 10,000         4 WEEKS     1,000 - 30,000         5 WEEKS     3,500 - 115,000       6-8 WEEKS     12,000 - 270,000        12  WEEKS     15,000 - 220,000                FEMALE AND NON-PREGNANT FEMALE:         LESS THAN 5 mIU/mL   [place] Ob Comp Less 14 Wks  07/04/2013   CLINICAL DATA:  Vaginal bleeding.  Beta HCG level, 26,683.  EXAM: OBSTETRIC <14 WK US AND TRANSVAGINAL OB US  TECHNIQUE: Both transabdominal and transvaginal ultrasound examinations were performed for complete evaluation of the gestation as well as the maternal uterus, adnexal regions, and pelvic cul-de-sac. Transvaginal technique was performed to assess early pregnancy.  COMPARISON:  None.  FINDINGS: Intrauterine gestational sac: Visualized/normal in shape.  Yolk sac:  Yes  Embryo:  Small embryo visualized  Cardiac Activity: Yes  Heart Rate:  113 bpm  CRL:   4.1 mm  mm   6 w 1 d                  US EDC: 02/26/2014  Maternal uterus/adnexae: Normal uterus. No separate hemorrhage. Two right ovarian cysts, 1 a simple appearing follicular cyst the other with a thicker wall likely the corpus luteum. Ovaries are otherwise unremarkable. No adnexal masses or free fluid.  IMPRESSION: 1. Single live intrauterine pregnancy. No emergent complication to the embryo or uterus. 2. Two left ovarian cysts, within the normal physiologic size  range. No adnexal masses or free fluid.   Electronically Signed   By: Amie Portland M.D.   On: 07/04/2013 16:56   US Ob Transvaginal  07/04/2013   CLINICAL DATA:  Vaginal bleeding.  Beta HCG level, 26,683.  EXAM: OBSTETRIC <14 WK [place] AND TRANSVAGINAL OB US  TECHNIQUE: Both transabdominal and transvaginal ultrasound examinations were performed for complete evaluation of the gestation as well as the maternal uterus, adnexal regions, and pelvic cul-de-sac. Transvaginal technique was performed to assess early pregnancy.  COMPARISON:  None.  FINDINGS: Intrauterine gestational sac: Visualized/normal in shape.  Yolk sac:  Yes  Embryo:  Small embryo visualized  Cardiac Activity: Yes  Heart Rate:  113 bpm  CRL:   4.1 mm  mm   6 w 1 d                  [place] EDC:  02/26/2014  Maternal uterus/adnexae: Normal uterus. No separate hemorrhage. Two right ovarian cysts, 1 a simple appearing follicular cyst the other with a thicker wall likely the corpus luteum. Ovaries are otherwise unremarkable. No adnexal masses or free fluid.  IMPRESSION: 1. Single live intrauterine pregnancy. No emergent complication to the embryo or uterus. 2. Two left ovarian cysts, within the normal physiologic size range. No adnexal masses or free fluid.   Electronically Signed   By: Amie Portland M.D.   On: 07/04/2013 16:56    Review of Systems  Constitutional: Positive for fever. Negative for chills.  Gastrointestinal: Positive for nausea. Negative for vomiting, abdominal pain, diarrhea and constipation.  Genitourinary: Negative for dysuria, urgency, frequency, hematuria and flank pain.       No vaginal discharge. No vaginal bleeding. No dysuria.   Musculoskeletal: Negative for back pain.   Physical Exam   Blood pressure 111/69, pulse 105, temperature 99.3 F (37.4 C), temperature source Oral, resp. rate 16, height 5\' 5"  (1.651 m), weight 117.663 kg (259 lb 6.4 oz), last menstrual period 05/23/2013, SpO2 100.00%.  Physical Exam  Constitutional: She is oriented to person, place, and time. She appears well-developed and well-nourished. No distress.  HENT:  Head: Normocephalic.  Eyes: Pupils are equal, round, and reactive to light.  Neck: Neck supple.  Respiratory: Effort normal.  GI: Soft. She exhibits no distension and no mass. There is no tenderness. There is no rebound and no guarding.  Genitourinary: Vaginal discharge found.  Speculum exam: Vagina - Small amount of creamy, brown discharge, no odor, 2 small clots in vault  Cervix - Scant contact bleeding Bimanual exam: Cervix closed Uterus non tender, normal size; gravid  Adnexa non tender, no masses bilaterally GC/Chlam, wet prep done Chaperone present for exam.   Neurological: She is alert and oriented to person,  place, and time.  Skin: Skin is warm. She is not diaphoretic.  Psychiatric: Her behavior is normal.    MAU Course  Procedures None  MDM CBC ABO/RH Quant Wet prep GC/Chlamydia Diflucan 150 mg - + yeast in urine Transvaginal US   Assessment and Plan   A:  1. Normal IUP (intrauterine pregnancy) on prenatal ultrasound   2. Vaginal bleeding in pregnancy   3.  Ovarian cysts  4.  Yeast vaginitis    P:  Discharge home in stable condition  Bleeding precautions discussed Return to MAU if symptoms worsen Pelvic rest Start prenatal care as soon as possible  RX: Diflucan; take in 3 days   Debbrah Alar, NP  07/04/2013, 7:33 PM

## 2013-07-05 LAB — GC/CHLAMYDIA PROBE AMP
CT Probe RNA: NEGATIVE
GC PROBE AMP APTIMA: NEGATIVE

## 2013-07-07 ENCOUNTER — Encounter: Payer: Self-pay | Admitting: Obstetrics

## 2013-07-08 LAB — URINE CULTURE
Colony Count: 30000
Special Requests: NORMAL

## 2013-07-12 ENCOUNTER — Ambulatory Visit (INDEPENDENT_AMBULATORY_CARE_PROVIDER_SITE_OTHER): Payer: BC Managed Care – PPO | Admitting: Advanced Practice Midwife

## 2013-07-12 ENCOUNTER — Encounter: Payer: Self-pay | Admitting: Advanced Practice Midwife

## 2013-07-12 VITALS — BP 108/77 | Temp 99.0°F | Wt 260.0 lb

## 2013-07-12 DIAGNOSIS — O219 Vomiting of pregnancy, unspecified: Secondary | ICD-10-CM

## 2013-07-12 DIAGNOSIS — Z3201 Encounter for pregnancy test, result positive: Secondary | ICD-10-CM

## 2013-07-12 DIAGNOSIS — Z3400 Encounter for supervision of normal first pregnancy, unspecified trimester: Secondary | ICD-10-CM

## 2013-07-12 DIAGNOSIS — Z833 Family history of diabetes mellitus: Secondary | ICD-10-CM

## 2013-07-12 DIAGNOSIS — R638 Other symptoms and signs concerning food and fluid intake: Secondary | ICD-10-CM

## 2013-07-12 LAB — POCT URINALYSIS DIPSTICK
BILIRUBIN UA: NEGATIVE
Glucose, UA: NEGATIVE
LEUKOCYTES UA: NEGATIVE
NITRITE UA: NEGATIVE
Spec Grav, UA: 1.02
Urobilinogen, UA: NEGATIVE
pH, UA: 5

## 2013-07-12 MED ORDER — DOXYLAMINE-PYRIDOXINE 10-10 MG PO TBEC: 10.0000 mg | DELAYED_RELEASE_TABLET | Freq: Every day | ORAL | Status: DC

## 2013-07-12 MED ORDER — ONDANSETRON HCL 4 MG PO TABS: 4.0000 mg | ORAL_TABLET | Freq: Three times a day (TID) | ORAL | Status: DC | PRN

## 2013-07-12 MED ORDER — PRENATAL COMPLETE 14-0.4 MG PO TABS: 1.0000 | ORAL_TABLET | Freq: Every day | ORAL | Status: AC

## 2013-07-12 NOTE — Progress Notes (Signed)
Pulse: 100 [facility] Patient states she was told she had a yeast infection and was treated with antibiotics. Patient states she has been throwing up all day and that her stomach is a little sore. Patient would like a refill on her prenatal vitamins and zofran. [facility].

## 2013-07-13 ENCOUNTER — Encounter: Payer: Self-pay | Admitting: Advanced Practice Midwife

## 2013-07-13 DIAGNOSIS — O219 Vomiting of pregnancy, unspecified: Secondary | ICD-10-CM | POA: Insufficient documentation

## 2013-07-13 DIAGNOSIS — R638 Other symptoms and signs concerning food and fluid intake: Secondary | ICD-10-CM | POA: Insufficient documentation

## 2013-07-13 DIAGNOSIS — Z833 Family history of diabetes mellitus: Secondary | ICD-10-CM | POA: Insufficient documentation

## 2013-07-13 DIAGNOSIS — Z3400 Encounter for supervision of normal first pregnancy, unspecified trimester: Secondary | ICD-10-CM | POA: Insufficient documentation

## 2013-07-13 LAB — VARICELLA ZOSTER ANTIBODY, IGG: Varicella IgG: 1645 Index — ABNORMAL HIGH (ref <135.00)

## 2013-07-13 LAB — OBSTETRIC PANEL
ANTIBODY SCREEN: NEGATIVE
BASOS ABS: 0 10*3/uL (ref 0.0–0.1)
BASOS PCT: 0 % (ref 0–1)
EOS ABS: 0 10*3/uL (ref 0.0–0.7)
Eosinophils Relative: 1 % (ref 0–5)
HCT: 40.9 % (ref 36.0–46.0)
HEMOGLOBIN: 13.9 g/dL (ref 12.0–15.0)
HEP B S AG: NEGATIVE
Lymphocytes Relative: 17 % (ref 12–46)
Lymphs Abs: 1.4 10*3/uL (ref 0.7–4.0)
MCH: 30.7 pg (ref 26.0–34.0)
MCHC: 34 g/dL (ref 30.0–36.0)
MCV: 90.3 fL (ref 78.0–100.0)
MONOS PCT: 6 % (ref 3–12)
Monocytes Absolute: 0.5 10*3/uL (ref 0.1–1.0)
NEUTROS PCT: 76 % (ref 43–77)
Neutro Abs: 6.3 10*3/uL (ref 1.7–7.7)
Platelets: 456 10*3/uL — ABNORMAL HIGH (ref 150–400)
RBC: 4.53 MIL/uL (ref 3.87–5.11)
RDW: 14 % (ref 11.5–15.5)
Rh Type: POSITIVE
Rubella: 2.7 Index — ABNORMAL HIGH (ref <0.90)
WBC: 8.2 10*3/uL (ref 4.0–10.5)

## 2013-07-13 LAB — HIV ANTIBODY (ROUTINE TESTING W REFLEX): HIV: NONREACTIVE

## 2013-07-13 LAB — TSH: TSH: 59.946 u[IU]/mL — AB (ref 0.350–4.500)

## 2013-07-13 LAB — VITAMIN D 25 HYDROXY (VIT D DEFICIENCY, FRACTURES): Vit D, 25-Hydroxy: 18 ng/mL — ABNORMAL LOW (ref 30–89)

## 2013-07-13 NOTE — Progress Notes (Signed)
   Subjective:    Jenna Bonilla is a G1P0 987w2d being seen today for her first obstetrical visit.  Her obstetrical history is significant for 1st trimester spotting. Patient does intend to breast feed. Pregnancy history fully reviewed.  Patient reports nausea and vomiting.  Filed Vitals:   07/12/13 1434  BP: 108/77  Temp: 99 F (37.2 C)  Weight: 260 lb (117.935 kg)    HISTORY: OB History  Gravida Para Term Preterm AB SAB TAB Ectopic Multiple Living  1             # Outcome Date GA Lbr Len/2nd Weight Sex Delivery Anes PTL Lv  1 CUR              Past Medical History  Diagnosis Date  . Asthma    Past Surgical History  Procedure Laterality Date  . Cholecystectomy     Family History  Problem Relation Age of Onset  . Diabetes Mother   . Heart disease Mother   . Cancer Father   . Diabetes Sister   . Diabetes Maternal Aunt   . Cancer Paternal Uncle   . Cancer Paternal Grandmother      Exam   Filed Vitals:   07/12/13 1434  BP: 108/77  Temp: 99 F (37.2 C)   Filed Vitals:   07/12/13 1434  BP: 108/77  Temp: 99 F (37.2 C)                                        System: Breast:  normal appearance, no masses or tenderness   Skin: normal coloration and turgor, no rashes    Neurologic: oriented, normal   Extremities: normal strength, tone, and muscle mass   HEENT PERRLA and thyroid without masses   Mouth/Teeth mucous membranes moist, pharynx normal without lesions   Neck supple and no masses   Cardiovascular: regular rate and rhythm, no murmurs or gallops   Respiratory:  appears well, vitals normal, no respiratory distress, acyanotic, normal RR, ear and throat exam is normal, neck free of mass or lymphadenopathy, chest clear, no wheezing, crepitations, rhonchi, normal symmetric air entry   Abdomen: soft, non-tender; bowel sounds normal; no masses,  no organomegaly          Assessment:    Pregnancy: G1P0 There are no active problems to  display for this patient. Nausea and Vomiting in Pregnancy Family Hx of DM, Elevated BMI      Plan:     Initial labs drawn. Prenatal vitamins. Reviewed NOB education. Handouts given. Problem list reviewed and updated. Genetic Screening discussed Quad Screen: plan at subsequent visits.  Ultrasound discussed; fetal survey: plan at 18-20 weeks. Patient threw up early 1 hour GCT, plan repeat in future visits. Patient has zofran PRN from MAU visit, refilled today. Reviewed OTC measures. Diclegis Rx given. Patient to notify the clinic if her N&V worsens or does not improve.   Follow up in 4 weeks. 80% of 50 min visit spent on counseling and coordination of care.     Banita Lehn 07/13/2013

## 2013-07-14 LAB — CULTURE, OB URINE: Colony Count: 60000

## 2013-07-14 LAB — HEMOGLOBINOPATHY EVALUATION
HGB A2 QUANT: 2.9 % (ref 2.2–3.2)
HGB A: 97.1 % (ref 96.8–97.8)
HGB F QUANT: 0 % (ref 0.0–2.0)
Hemoglobin Other: 0 %
Hgb S Quant: 0 %

## 2013-07-19 ENCOUNTER — Other Ambulatory Visit: Payer: Self-pay | Admitting: Advanced Practice Midwife

## 2013-07-19 ENCOUNTER — Ambulatory Visit (INDEPENDENT_AMBULATORY_CARE_PROVIDER_SITE_OTHER): Payer: BC Managed Care – PPO | Admitting: Advanced Practice Midwife

## 2013-07-19 ENCOUNTER — Ambulatory Visit (INDEPENDENT_AMBULATORY_CARE_PROVIDER_SITE_OTHER): Payer: BC Managed Care – PPO

## 2013-07-19 VITALS — BP 125/82 | Temp 98.4°F | Wt 267.0 lb

## 2013-07-19 DIAGNOSIS — Z3400 Encounter for supervision of normal first pregnancy, unspecified trimester: Secondary | ICD-10-CM

## 2013-07-19 DIAGNOSIS — E039 Hypothyroidism, unspecified: Secondary | ICD-10-CM | POA: Insufficient documentation

## 2013-07-19 DIAGNOSIS — O99210 Obesity complicating pregnancy, unspecified trimester: Secondary | ICD-10-CM

## 2013-07-19 DIAGNOSIS — O3680X0 Pregnancy with inconclusive fetal viability, not applicable or unspecified: Secondary | ICD-10-CM

## 2013-07-19 LAB — POCT URINALYSIS DIPSTICK
Bilirubin, UA: NEGATIVE
Blood, UA: NEGATIVE
Glucose, UA: NEGATIVE
Ketones, UA: NEGATIVE
LEUKOCYTES UA: NEGATIVE
NITRITE UA: NEGATIVE
PH UA: 6
PROTEIN UA: NEGATIVE
Spec Grav, UA: 1.015
UROBILINOGEN UA: NEGATIVE

## 2013-07-19 LAB — US OB COMP LESS 14 WKS

## 2013-07-19 MED ORDER — LEVOTHYROXINE SODIUM 100 MCG PO TABS: 100.0000 ug | ORAL_TABLET | Freq: Every day | ORAL | Status: DC

## 2013-07-19 NOTE — Progress Notes (Signed)
Reviewed hypothyroidism with patient. Started patient on 100 mcg of synthroid today. TSH, free T4 and T3 done today. Reviewed plan of care w/ MD Particia Nearing who agreed. Patient to have consult w/ MFM and sequential screen. Patient to establish care with endocrine.   Gave hypothyroid and synthroid handouts.   Formal US done today, reassuring fetal status, FHR 168. S=D.  20 min spent with patient greater than 80% spent in counseling and coordination of care.    Jenna Bonilla Wilson Singer CNM

## 2013-07-19 NOTE — Progress Notes (Signed)
Pulse: 64 Patient denies any concerns.

## 2013-07-20 ENCOUNTER — Encounter: Payer: Self-pay | Admitting: *Deleted

## 2013-07-20 ENCOUNTER — Encounter (HOSPITAL_COMMUNITY): Payer: Self-pay | Admitting: Advanced Practice Midwife

## 2013-07-20 LAB — THYROID PEROXIDASE ANTIBODY: Thyroperoxidase Ab SerPl-aCnc: 9363 IU/mL — ABNORMAL HIGH (ref <35.0)

## 2013-07-20 LAB — T3: T3, Total: 115.4 ng/dL (ref 80.0–204.0)

## 2013-07-20 LAB — TSH: TSH: 55.574 u[IU]/mL — ABNORMAL HIGH (ref 0.350–4.500)

## 2013-07-20 LAB — T4, FREE: Free T4: 0.54 ng/dL — ABNORMAL LOW (ref 0.80–1.80)

## 2013-07-21 ENCOUNTER — Encounter: Payer: Self-pay | Admitting: *Deleted

## 2013-07-22 ENCOUNTER — Encounter: Payer: BC Managed Care – PPO | Admitting: Advanced Practice Midwife

## 2013-07-22 ENCOUNTER — Encounter: Payer: Self-pay | Admitting: Advanced Practice Midwife

## 2013-07-26 ENCOUNTER — Encounter: Payer: Self-pay | Admitting: Advanced Practice Midwife

## 2013-07-26 ENCOUNTER — Ambulatory Visit (HOSPITAL_COMMUNITY)
Admission: RE | Admit: 2013-07-26 | Discharge: 2013-07-26 | Disposition: A | Discharge status: Home / Self Care | Payer: BC Managed Care – PPO | Source: Ambulatory Visit | Attending: Advanced Practice Midwife | Admitting: Advanced Practice Midwife

## 2013-07-26 ENCOUNTER — Encounter (HOSPITAL_COMMUNITY): Payer: BC Managed Care – PPO

## 2013-07-26 ENCOUNTER — Other Ambulatory Visit: Payer: Self-pay | Admitting: Advanced Practice Midwife

## 2013-07-26 ENCOUNTER — Inpatient Hospital Stay (HOSPITAL_COMMUNITY): Admission: RE | Admit: 2013-07-26 | Payer: Medicaid Other | Source: Ambulatory Visit

## 2013-07-26 VITALS — BP 107/66 | HR 65 | Wt 262.0 lb

## 2013-07-26 DIAGNOSIS — E039 Hypothyroidism, unspecified: Secondary | ICD-10-CM | POA: Insufficient documentation

## 2013-07-26 DIAGNOSIS — R197 Diarrhea, unspecified: Secondary | ICD-10-CM | POA: Insufficient documentation

## 2013-07-26 DIAGNOSIS — K5090 Crohn's disease, unspecified, without complications: Secondary | ICD-10-CM | POA: Insufficient documentation

## 2013-07-26 DIAGNOSIS — Z3400 Encounter for supervision of normal first pregnancy, unspecified trimester: Secondary | ICD-10-CM

## 2013-07-26 DIAGNOSIS — E079 Disorder of thyroid, unspecified: Secondary | ICD-10-CM | POA: Insufficient documentation

## 2013-07-26 DIAGNOSIS — R109 Unspecified abdominal pain: Secondary | ICD-10-CM | POA: Insufficient documentation

## 2013-07-26 DIAGNOSIS — O99280 Endocrine, nutritional and metabolic diseases complicating pregnancy, unspecified trimester: Principal | ICD-10-CM

## 2013-07-26 DIAGNOSIS — O99281 Endocrine, nutritional and metabolic diseases complicating pregnancy, first trimester: Secondary | ICD-10-CM

## 2013-07-26 NOTE — Consult Note (Signed)
Maternal Fetal Medicine Consultation  Requesting Provider(s): Amy Dessa PhiHowell Wren, CNM  Reason for consultation: New diagnosis of hypothyroidism  HPI: Jenna Bonilla is a 28 yo G1P0 currently at 89 1/7 weeks who is seen today for consultation due to hypothyroidism.  As part of her prenatal screen, she had thyroid peroxidase antibodies performed which were elevated (9363 IU/ml) as well as an elevated TSH (55.57) and low FT4 (0.54).  The patient reports that she "was sleeping all the time" but otherwise without symptoms.  The couple had tried to conceive for 2 years unsuccessfully.  The patient was recently started on Synthroid 100 micrograms daily and is already "feeling better".   The patient also reports a history of Crohn's disease that was diagnosed in Nov 2014.  She initially presented with abdominal pain and diarrhea.  She was started on Humira and has been symptom free but elected to stop taking the medications once she found out that she was pregnant.  She is without complaints today and her prenatal course has otherwise been uncomplicated.   OB History: OB History   Grav Para Term Preterm Abortions TAB SAB Ect Mult Living   1               PMH:  Past Medical History  Diagnosis Date  . Asthma   Hypothyroidism Hx of Crohn's disease - diagnosed 2014  PSH:  Past Surgical History  Procedure Laterality Date  . Cholecystectomy     Meds:  Current Outpatient Prescriptions on File Prior to Encounter  Medication Sig Dispense Refill  . albuterol (PROVENTIL HFA;VENTOLIN HFA) 108 (90 BASE) MCG/ACT inhaler Inhale 2 puffs into the lungs 2 (two) times daily as needed for wheezing or shortness of breath.       . Doxylamine-Pyridoxine (DICLEGIS) 10-10 MG TBEC Take 10 mg by mouth at bedtime. 2 tabs at bedtime. If having nausea during the day, take an additional 1 tab during the day, may take additional tab 1 in am and 1 in pm if needed. Max dose 4 tabs daily.  60 tablet  3  . levothyroxine  (SYNTHROID) 100 MCG tablet Take 1 tablet (100 mcg total) by mouth daily before breakfast.  40 tablet  1  . ondansetron (ZOFRAN) 4 MG tablet Take 1 tablet (4 mg total) by mouth every 8 (eight) hours as needed for nausea or vomiting.  20 tablet  1  . Prenatal Vit-Fe Fumarate-FA (PRENATAL COMPLETE) 14-0.4 MG TABS Take 1 tablet by mouth daily.  60 each  10   No current facility-administered medications on file prior to encounter.   Allergies:  Allergies  Allergen Reactions  . Penicillins Hives and Shortness Of Breath  . Tomato Hives and Shortness Of Breath   FH:  Family History  Problem Relation Age of Onset  . Diabetes Mother   . Heart disease Mother   . Cancer Father   . Diabetes Sister   . Diabetes Maternal Aunt   . Cancer Paternal Uncle   . Cancer Paternal Grandmother    Soc:  History   Social History  . Marital Status: Single    Spouse Name: N/A    Number of Children: N/A  . Years of Education: N/A   Occupational History  . Not on file.   Social History Main Topics  . Smoking status: Never Smoker   . Smokeless tobacco: Not on file  . Alcohol Use: No  . Drug Use: No  . Sexual Activity: Not on file   Other Topics  Concern  . Not on file   Social History Narrative  . No narrative on file    Review of Systems: no vaginal bleeding or cramping/contractions, no LOF, no nausea/vomiting. All other systems reviewed and are negative.  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  Anti peroxidase antibodies are associated with an increased risk of miscarriage, even in euthyroid patients.  Treatment appears to decrease this risk.  Concur with current dose of Synthroid - recommend checking TSH every 4 weeks throughout the course of pregnancy.  A good fetal and maternal outcome depends upon treating maternal  hypothyroidism with thyroid hormone (T4). The goal of treatment is to maintain the mother's serum TSH in the trimester-specific reference range (0.1 to 2.5 mU/L, 0.2 to 3 mU/L, and 0.3 to 3 mU/L for the first, second, and third trimesters, respectively). In general, the dose will need to be decreased post partum (by 25-50%).  Concur with Endocrinology referral.          3) Crohn's disease - no history of prior surgery, no history of rectal vaginal fistulae.  Feel that patient is a candidate for vaginal delivery, but would avoid episiotomy is possible.  Additionally, strongly recommend referral to GI and treatment as needed to avoid exacerbation during pregnancy.   Thank you for the opportunity to be a part of the care of Jenna Bonilla. Please contact our office if we can be of further assistance.   I spent approximately 30 minutes with this patient with over 50% of time spent in face-to-face counseling.  Alpha Gula, MD Maternal Fetal Medicine

## 2013-07-28 ENCOUNTER — Encounter: Payer: Self-pay | Admitting: Advanced Practice Midwife

## 2013-07-28 NOTE — Addendum Note (Signed)
Encounter addended by: Michaelina Blandino E Florine Sprenkle, RN on: 07/28/2013 11:26 AM<BR>     Documentation filed: Charges VN

## 2013-08-05 ENCOUNTER — Encounter: Payer: BC Managed Care – PPO | Admitting: Advanced Practice Midwife

## 2013-08-09 ENCOUNTER — Encounter: Payer: BC Managed Care – PPO | Admitting: Advanced Practice Midwife

## 2013-08-16 ENCOUNTER — Ambulatory Visit (INDEPENDENT_AMBULATORY_CARE_PROVIDER_SITE_OTHER): Payer: BC Managed Care – PPO | Admitting: Advanced Practice Midwife

## 2013-08-16 VITALS — BP 108/76 | Temp 98.0°F | Wt 251.0 lb

## 2013-08-16 DIAGNOSIS — Z3400 Encounter for supervision of normal first pregnancy, unspecified trimester: Secondary | ICD-10-CM

## 2013-08-16 LAB — POCT URINALYSIS DIPSTICK
BILIRUBIN UA: NEGATIVE
Blood, UA: NEGATIVE
GLUCOSE UA: NEGATIVE
Ketones, UA: NEGATIVE
Leukocytes, UA: NEGATIVE
Nitrite, UA: NEGATIVE
PROTEIN UA: NEGATIVE
Spec Grav, UA: 1.02
Urobilinogen, UA: NEGATIVE
pH, UA: 5

## 2013-08-16 NOTE — Progress Notes (Signed)
Pulse: 92 Patient denies any concerns.  

## 2013-08-17 LAB — TSH: TSH: 8.156 u[IU]/mL — AB (ref 0.350–4.500)

## 2013-08-17 NOTE — Progress Notes (Signed)
Doing well. Able to briefly hear FHR. Patient may RTC if she would like to hear again.  TSH today, plan pending.  Sakai Wolford Wilson SingerWren CNM

## 2013-08-18 ENCOUNTER — Encounter (HOSPITAL_COMMUNITY): Payer: Self-pay | Admitting: *Deleted

## 2013-08-18 ENCOUNTER — Inpatient Hospital Stay (HOSPITAL_COMMUNITY)
Admission: AD | Admit: 2013-08-18 | Discharge: 2013-08-18 | Disposition: A | Discharge status: Home / Self Care | Payer: BC Managed Care – PPO | Source: Ambulatory Visit | Attending: Obstetrics | Admitting: Obstetrics

## 2013-08-18 DIAGNOSIS — O26899 Other specified pregnancy related conditions, unspecified trimester: Secondary | ICD-10-CM

## 2013-08-18 DIAGNOSIS — R1031 Right lower quadrant pain: Secondary | ICD-10-CM | POA: Insufficient documentation

## 2013-08-18 DIAGNOSIS — O99891 Other specified diseases and conditions complicating pregnancy: Secondary | ICD-10-CM | POA: Insufficient documentation

## 2013-08-18 DIAGNOSIS — O9989 Other specified diseases and conditions complicating pregnancy, childbirth and the puerperium: Secondary | ICD-10-CM

## 2013-08-18 DIAGNOSIS — R109 Unspecified abdominal pain: Secondary | ICD-10-CM

## 2013-08-18 LAB — CBC
HEMATOCRIT: 33.8 % — AB (ref 36.0–46.0)
Hemoglobin: 12 g/dL (ref 12.0–15.0)
MCH: 31.4 pg (ref 26.0–34.0)
MCHC: 35.5 g/dL (ref 30.0–36.0)
MCV: 88.5 fL (ref 78.0–100.0)
Platelets: 330 10*3/uL (ref 150–400)
RBC: 3.82 MIL/uL — ABNORMAL LOW (ref 3.87–5.11)
RDW: 13.5 % (ref 11.5–15.5)
WBC: 12.6 10*3/uL — ABNORMAL HIGH (ref 4.0–10.5)

## 2013-08-18 LAB — URINALYSIS, ROUTINE W REFLEX MICROSCOPIC
BILIRUBIN URINE: NEGATIVE
Glucose, UA: 500 mg/dL — AB
Ketones, ur: 15 mg/dL — AB
Leukocytes, UA: NEGATIVE
NITRITE: NEGATIVE
Protein, ur: NEGATIVE mg/dL
Specific Gravity, Urine: 1.025 (ref 1.005–1.030)
UROBILINOGEN UA: 0.2 mg/dL (ref 0.0–1.0)
pH: 6 (ref 5.0–8.0)

## 2013-08-18 LAB — WET PREP, GENITAL
Clue Cells Wet Prep HPF POC: NONE SEEN
Trich, Wet Prep: NONE SEEN
YEAST WET PREP: NONE SEEN

## 2013-08-18 LAB — URINE MICROSCOPIC-ADD ON

## 2013-08-18 MED ORDER — ACETAMINOPHEN 500 MG PO TABS
1000.0000 mg | ORAL_TABLET | Freq: Once | ORAL | Status: AC
  Administered 2013-08-18: 1000 mg via ORAL
  Filled 2013-08-18: qty 2

## 2013-08-18 NOTE — MAU Note (Signed)
Patient states she has been having "little sharp" pain off and on today. Denies bleeding or discharge, Vomited x 1 today.

## 2013-08-18 NOTE — MAU Provider Note (Signed)
History     CSN: [id number]  Arrival date and time: 08/18/13 1719   First Provider Initiated Contact with Patient 08/18/13 1756      Chief Complaint  Patient presents with  . Abdominal Pain   HPI Jenna Bonilla is a 28 y.o. G1P0 at 4434w3d who presents to MAU today with new onset RLQ pain. The patient states that pain started ~ 5 minutes prior to arrival. She states that the pain is sharp and comes and goes. She rates her pain at 7/10 now. She has not taken any pain medicine. She denies vaginal bleeding, discharge, fever, UTI symptoms or recent intercourse.   OB History   Grav Para Term Preterm Abortions TAB SAB Ect Mult Living   1               Past Medical History  Diagnosis Date  . Asthma     Past Surgical History  Procedure Laterality Date  . Cholecystectomy      Family History  Problem Relation Age of Onset  . Diabetes Mother   . Heart disease Mother   . Cancer Father   . Diabetes Sister   . Diabetes Maternal Aunt   . Cancer Paternal Uncle   . Cancer Paternal Grandmother     History  Substance Use Topics  . Smoking status: Never Smoker   . Smokeless tobacco: Not on file  . Alcohol Use: No    Allergies:  Allergies  Allergen Reactions  . Penicillins Hives and Shortness Of Breath  . Tomato Hives and Shortness Of Breath    Prescriptions prior to admission  Medication Sig Dispense Refill  . Doxylamine-Pyridoxine (DICLEGIS) 10-10 MG TBEC Take 10 mg by mouth at bedtime. 2 tabs at bedtime. If having nausea during the day, take an additional 1 tab during the day, may take additional tab 1 in am and 1 in pm if needed. Max dose 4 tabs daily.  60 tablet  3  . levothyroxine (SYNTHROID) 100 MCG tablet Take 1 tablet (100 mcg total) by mouth daily before breakfast.  40 tablet  1  . Prenatal Vit-Fe Fumarate-FA (PRENATAL COMPLETE) 14-0.4 MG TABS Take 1 tablet by mouth daily.  60 each  10  . albuterol (PROVENTIL HFA;VENTOLIN HFA) 108 (90 BASE) MCG/ACT inhaler  Inhale 2 puffs into the lungs 2 (two) times daily as needed for wheezing or shortness of breath.         Review of Systems  Constitutional: Negative for fever and malaise/fatigue.  Gastrointestinal: Positive for nausea, vomiting and abdominal pain. Negative for diarrhea and constipation.  Genitourinary: Negative for dysuria, urgency and frequency.       Neg - vaginal bleeding, discharge   Physical Exam   Blood pressure 119/74, pulse 98, temperature 99.3 F (37.4 C), temperature source Oral, resp. rate 20, height 5\' 5"  (1.651 m), weight 264 lb 12.8 oz (120.112 kg), last menstrual period 05/23/2013, SpO2 99.00%.  Physical Exam  Constitutional: She appears well-developed and well-nourished. No distress.  HENT:  Head: Normocephalic and atraumatic.  Cardiovascular: Normal rate, regular rhythm and normal heart sounds.   Respiratory: Effort normal and breath sounds normal. No respiratory distress.  GI: Soft. Bowel sounds are normal. She exhibits no distension and no mass. There is tenderness (very mild tenderness to palpation of the RLQ). There is no rebound and no guarding.  Neurological: She is alert.  Skin: Skin is warm and dry. No erythema.  Psychiatric: She has a normal mood and affect.  Results for orders placed during the hospital encounter of 08/18/13 (from the past 24 hour(s))  URINALYSIS, ROUTINE W REFLEX MICROSCOPIC     Status: Abnormal   Collection Time    08/18/13  5:35 PM      Result Value Ref Range   Color, Urine YELLOW  YELLOW   APPearance CLEAR  CLEAR   Specific Gravity, Urine 1.025  1.005 - 1.030   pH 6.0  5.0 - 8.0   Glucose, UA 500 (*) NEGATIVE mg/dL   Hgb urine dipstick MODERATE (*) NEGATIVE   Bilirubin Urine NEGATIVE  NEGATIVE   Ketones, ur 15 (*) NEGATIVE mg/dL   Protein, ur NEGATIVE  NEGATIVE mg/dL   Urobilinogen, UA 0.2  0.0 - 1.0 mg/dL   Nitrite NEGATIVE  NEGATIVE   Leukocytes, UA NEGATIVE  NEGATIVE  URINE MICROSCOPIC-ADD ON     Status: Abnormal    Collection Time    08/18/13  5:35 PM      Result Value Ref Range   Squamous Epithelial / LPF FEW (*) RARE   RBC / HPF 3-6  <3 RBC/hpf   Bacteria, UA RARE  RARE  CBC     Status: Abnormal   Collection Time    08/18/13  6:10 PM      Result Value Ref Range   WBC 12.6 (*) 4.0 - 10.5 K/uL   RBC 3.82 (*) 3.87 - 5.11 MIL/uL   Hemoglobin 12.0  12.0 - 15.0 g/dL   HCT 47.0 (*) 96.2 - 83.6 %   MCV 88.5  78.0 - 100.0 fL   MCH 31.4  26.0 - 34.0 pg   MCHC 35.5  30.0 - 36.0 g/dL   RDW 62.9  47.6 - 54.6 %   Platelets 330  150 - 400 K/uL  WET PREP, GENITAL     Status: Abnormal   Collection Time    08/18/13  6:20 PM      Result Value Ref Range   Yeast Wet Prep HPF POC NONE SEEN  NONE SEEN   Trich, Wet Prep NONE SEEN  NONE SEEN   Clue Cells Wet Prep HPF POC NONE SEEN  NONE SEEN   WBC, Wet Prep HPF POC MANY (*) NONE SEEN    MAU Course  Procedures None  MDM FHT- 156 bpm with doppler UA, wet prep and CBC today Urine culture ordered  Assessment and Plan  A: Abdominal pain in pregnancy  P: Discharge home Second trimester warning signs discussed Patient advised to keep appointment for follow-up with [facility] as scheduled Patient may return to MAU as needed or if her condition were to change or worsen  Freddi Starr, PA-C  08/18/2013, 6:55 PM

## 2013-08-18 NOTE — Discharge Instructions (Signed)
Abdominal Pain During Pregnancy  Belly (abdominal) pain is common during pregnancy. Most of the time, it is not a serious problem. Other times, it can be a sign that something is wrong with the pregnancy. Always tell your doctor if you have belly pain.  HOME CARE  Monitor your belly pain for any changes. The following actions may help you feel better:  · Do not have sex (intercourse) or put anything in your vagina until you feel better.  · Rest until your pain stops.  · Drink clear fluids if you feel sick to your stomach (nauseous). Do not eat solid food until you feel better.  · Only take medicine as told by your doctor.  · Keep all doctor visits as told.  GET HELP RIGHT AWAY IF:   · You are bleeding, leaking fluid, or pieces of tissue come out of your vagina.  · You have more pain or cramping.  · You keep throwing up (vomiting).  · You have pain when you pee (urinate) or have blood in your pee.  · You have a fever.  · You do not feel your baby moving as much.  · You feel very weak or feel like passing out.  · You have trouble breathing, with or without belly pain.  · You have a very bad headache and belly pain.  · You have fluid leaking from your vagina and belly pain.  · You keep having watery poop (diarrhea).  · Your belly pain does not go away after resting, or the pain gets worse.  MAKE SURE YOU:   · Understand these instructions.  · Will watch your condition.  · Will get help right away if you are not doing well or get worse.  Document Released: 05/21/2009 Document Revised: 02/02/2013 Document Reviewed: 12/30/2012  [employer]® Patient Information ©2014 [employer].

## 2013-08-19 ENCOUNTER — Other Ambulatory Visit: Payer: Self-pay | Admitting: Advanced Practice Midwife

## 2013-08-19 DIAGNOSIS — E039 Hypothyroidism, unspecified: Secondary | ICD-10-CM

## 2013-08-19 LAB — CULTURE, OB URINE: SPECIAL REQUESTS: NORMAL

## 2013-08-19 MED ORDER — LEVOTHYROXINE SODIUM 75 MCG PO TABS: 75.0000 ug | ORAL_TABLET | Freq: Every day | ORAL | Status: DC

## 2013-08-19 MED ORDER — LEVOTHYROXINE SODIUM 125 MCG PO TABS: 125.0000 ug | ORAL_TABLET | Freq: Every day | ORAL | Status: AC

## 2013-08-19 NOTE — Progress Notes (Signed)
Patient had been taking 100 mcg, called and adjusted dosage to 125 mcg. Will repeat level in 4 weeks.  Kareem Aul Wilson Singer CNM

## 2013-09-07 ENCOUNTER — Encounter (HOSPITAL_COMMUNITY): Payer: Self-pay | Admitting: Emergency Medicine

## 2013-09-07 ENCOUNTER — Emergency Department (HOSPITAL_COMMUNITY)
Admission: EM | Admit: 2013-09-07 | Discharge: 2013-09-08 | Disposition: A | Discharge status: Home / Self Care | Payer: BC Managed Care – PPO | Attending: Emergency Medicine | Admitting: Emergency Medicine

## 2013-09-07 DIAGNOSIS — J45909 Unspecified asthma, uncomplicated: Secondary | ICD-10-CM | POA: Insufficient documentation

## 2013-09-07 DIAGNOSIS — Z880 Allergy status to penicillin: Secondary | ICD-10-CM | POA: Insufficient documentation

## 2013-09-07 DIAGNOSIS — O9989 Other specified diseases and conditions complicating pregnancy, childbirth and the puerperium: Secondary | ICD-10-CM | POA: Insufficient documentation

## 2013-09-07 DIAGNOSIS — R51 Headache: Secondary | ICD-10-CM | POA: Insufficient documentation

## 2013-09-07 DIAGNOSIS — R519 Headache, unspecified: Secondary | ICD-10-CM

## 2013-09-07 DIAGNOSIS — O209 Hemorrhage in early pregnancy, unspecified: Secondary | ICD-10-CM | POA: Insufficient documentation

## 2013-09-07 DIAGNOSIS — Z79899 Other long term (current) drug therapy: Secondary | ICD-10-CM | POA: Insufficient documentation

## 2013-09-07 LAB — I-STAT CHEM 8, ED
BUN: <3 mg/dL — ABNORMAL LOW (ref 6–23)
CALCIUM ION: 1.31 mmol/L — AB (ref 1.12–1.23)
Chloride: 102 mEq/L (ref 96–112)
Creatinine, Ser: 0.7 mg/dL (ref 0.50–1.10)
GLUCOSE: 141 mg/dL — AB (ref 70–99)
HEMATOCRIT: 38 % (ref 36.0–46.0)
Hemoglobin: 12.9 g/dL (ref 12.0–15.0)
POTASSIUM: 3.7 meq/L (ref 3.7–5.3)
Sodium: 137 mEq/L (ref 137–147)
TCO2: 22 mmol/L (ref 0–100)

## 2013-09-07 LAB — HCG, QUANTITATIVE, PREGNANCY: hCG, Beta Chain, Quant, S: 37553 m[IU]/mL — ABNORMAL HIGH (ref <5)

## 2013-09-07 LAB — POC URINE PREG, ED: PREG TEST UR: POSITIVE — AB

## 2013-09-07 NOTE — ED Notes (Addendum)
Pt reporting headache for several days, also is [redacted] weeks pregnant with first pregnancy. States earlier today noticed small amount of blood on tissue after urinating. Pt reports seeing stars with headache. Pt is a x 4, is ambulatory. BP 119/85. States has seen OBGYN, no problems with pregnancy thus far.

## 2013-09-08 ENCOUNTER — Encounter: Payer: Self-pay | Admitting: Obstetrics

## 2013-09-08 ENCOUNTER — Ambulatory Visit (INDEPENDENT_AMBULATORY_CARE_PROVIDER_SITE_OTHER): Payer: BC Managed Care – PPO | Admitting: Obstetrics

## 2013-09-08 VITALS — BP 128/89 | Temp 98.2°F | Wt 264.0 lb

## 2013-09-08 DIAGNOSIS — J302 Other seasonal allergic rhinitis: Secondary | ICD-10-CM | POA: Insufficient documentation

## 2013-09-08 DIAGNOSIS — K219 Gastro-esophageal reflux disease without esophagitis: Secondary | ICD-10-CM

## 2013-09-08 DIAGNOSIS — O9989 Other specified diseases and conditions complicating pregnancy, childbirth and the puerperium: Secondary | ICD-10-CM

## 2013-09-08 DIAGNOSIS — O26899 Other specified pregnancy related conditions, unspecified trimester: Secondary | ICD-10-CM

## 2013-09-08 DIAGNOSIS — R51 Headache: Secondary | ICD-10-CM

## 2013-09-08 DIAGNOSIS — Z3400 Encounter for supervision of normal first pregnancy, unspecified trimester: Secondary | ICD-10-CM

## 2013-09-08 DIAGNOSIS — J309 Allergic rhinitis, unspecified: Secondary | ICD-10-CM

## 2013-09-08 LAB — URINALYSIS, ROUTINE W REFLEX MICROSCOPIC
Bilirubin Urine: NEGATIVE
Glucose, UA: NEGATIVE mg/dL
KETONES UR: 15 mg/dL — AB
Leukocytes, UA: NEGATIVE
Nitrite: NEGATIVE
Protein, ur: NEGATIVE mg/dL
SPECIFIC GRAVITY, URINE: 1.026 (ref 1.005–1.030)
UROBILINOGEN UA: 0.2 mg/dL (ref 0.0–1.0)
pH: 6 (ref 5.0–8.0)

## 2013-09-08 LAB — URINE MICROSCOPIC-ADD ON

## 2013-09-08 LAB — POCT URINALYSIS DIPSTICK
Bilirubin, UA: NEGATIVE
Ketones, UA: NEGATIVE
NITRITE UA: NEGATIVE
PH UA: 6
Spec Grav, UA: 1.02
Urobilinogen, UA: NEGATIVE

## 2013-09-08 MED ORDER — LORATADINE 10 MG PO TABS: 10.0000 mg | ORAL_TABLET | Freq: Every day | ORAL | Status: AC

## 2013-09-08 MED ORDER — OMEPRAZOLE 20 MG PO CPDR: 20.0000 mg | DELAYED_RELEASE_CAPSULE | Freq: Two times a day (BID) | ORAL | Status: AC

## 2013-09-08 MED ORDER — BUTALBITAL-APAP-CAFFEINE 50-325-40 MG PO TABS: 2.0000 | ORAL_TABLET | Freq: Four times a day (QID) | ORAL | Status: AC | PRN

## 2013-09-08 MED ORDER — OXYCODONE-ACETAMINOPHEN 5-325 MG PO TABS
2.0000 | ORAL_TABLET | Freq: Once | ORAL | Status: AC
  Administered 2013-09-08: 2 via ORAL
  Filled 2013-09-08: qty 2

## 2013-09-08 NOTE — ED Provider Notes (Signed)
CSN: [id number]     Arrival date & time 09/07/13  1827 History   First MD Initiated Contact with Patient 09/08/13 0002     Chief Complaint  Patient presents with  . Headache  . Vaginal Bleeding     (Consider location/radiation/quality/duration/timing/severity/associated sxs/prior Treatment) HPI  Patient is a 28 yo G1 P1 F with a 15 week confirmed IUP who presents with 2-3 days of diffuse headache with most significant discomfort in the temporal region bilaterally. She has a history of similar headaches but, says this is worse than usual. No trauma. No fever. No neuro deficits. NO fever. Nothing makes headache worse or better. No vomiting or diarrhea. Pain is throbbing and moderately severe.   Patient voided urine this evening at work and noticed a scant amount of blood on tissue. This concerned her so she came to ED. She had an U/S at 8 wks which was unremarkable. No abdominal pain.   Past Medical History  Diagnosis Date  . Asthma    Past Surgical History  Procedure Laterality Date  . Cholecystectomy     Family History  Problem Relation Age of Onset  . Diabetes Mother   . Heart disease Mother   . Cancer Father   . Diabetes Sister   . Diabetes Maternal Aunt   . Cancer Paternal Uncle   . Cancer Paternal Grandmother    History  Substance Use Topics  . Smoking status: Never Smoker   . Smokeless tobacco: Not on file  . Alcohol Use: No   OB History   Grav Para Term Preterm Abortions TAB SAB Ect Mult Living   1              Review of Systems Ten point review of symptoms performed and is negative with the exception of symptoms noted above.     Allergies  Penicillins and Tomato  Home Medications   Current Outpatient Rx  Name  Route  Sig  Dispense  Refill  . acetaminophen (TYLENOL) 325 MG tablet   Oral   Take 650 mg by mouth every 6 (six) hours as needed for moderate pain.         Marland Kitchen. levothyroxine (SYNTHROID) 125 MCG tablet   Oral   Take 1 tablet (125 mcg total)  by mouth daily before breakfast.   40 tablet   1   . Prenatal Vit-Fe Fumarate-FA (PRENATAL COMPLETE) 14-0.4 MG TABS   Oral   Take 1 tablet by mouth daily.   60 each   10   . albuterol (PROVENTIL HFA;VENTOLIN HFA) 108 (90 BASE) MCG/ACT inhaler   Inhalation   Inhale 2 puffs into the lungs 2 (two) times daily as needed for wheezing or shortness of breath.           BP 116/65  Pulse 85  Temp(Src) 98.7 F (37.1 C) (Oral)  Resp 12  Wt 267 lb (121.11 kg)  SpO2 100%  LMP 05/23/2013 Physical Exam Gen: well developed and well nourished appearing Head: NCAT Eyes: PERL, EOMI, no papilledema Nose: no epistaixis or rhinorrhea Mouth/throat: mucosa is moist and pink Neck: supple, no stridor Lungs: CTA B, no wheezing, rhonchi or rales CV: RRR, no murmur, extremities appear well perfused.  Abd: Obese, soft, notender, nondistended Back: no ttp, no cva ttp Skin: warm and dry Ext: normal to inspection, no dependent edema Neuro: CN ii-xii grossly intact, no focal deficits Psyche; normal affect,  calm and cooperative.   ED Course  Procedures (including critical care time) Labs  Review  Results for orders placed during the hospital encounter of 09/07/13 (from the past 24 hour(s))  HCG, QUANTITATIVE, PREGNANCY     Status: Abnormal   Collection Time    09/07/13  6:50 PM      Result Value Ref Range   hCG, Beta Chain, Quant, S 37553 (*) <5 mIU/mL  I-STAT CHEM 8, ED     Status: Abnormal   Collection Time    09/07/13  7:02 PM      Result Value Ref Range   Sodium 137  137 - 147 mEq/L   Potassium 3.7  3.7 - 5.3 mEq/L   Chloride 102  96 - 112 mEq/L   BUN <3 (*) 6 - 23 mg/dL   Creatinine, Ser 1.10  0.50 - 1.10 mg/dL   Glucose, Bld 315 (*) 70 - 99 mg/dL   Calcium, Ion 9.45 (*) 1.12 - 1.23 mmol/L   TCO2 22  0 - 100 mmol/L   Hemoglobin 12.9  12.0 - 15.0 g/dL   HCT 85.9  29.2 - 44.6 %  POC URINE PREG, ED     Status: Abnormal   Collection Time    09/07/13  7:07 PM      Result Value Ref  Range   Preg Test, Ur POSITIVE (*) NEGATIVE     MDM    Patient with uncomplicated headache. Normotensive. No concern for [facility] or infectious process. Will check U/A for UTI. Percocet for acute pain. Plan to discahrge.   0128: U/A negative for UTI. Patient feeling better. STable for d/c.   Brandt Loosen, MD 09/08/13 [phone]

## 2013-09-08 NOTE — Progress Notes (Signed)
Pulse - 112 Pt states she has been having severe headaches with vision changes for the last 3 days. Pt states tylenol was not helping. Pt was seen in the emergency room and treated. Pt states her headache is tolerable.

## 2013-09-09 LAB — AFP, QUAD SCREEN
AFP: 21.4 IU/mL
Age Alone: 1:858 {titer}
CURR GEST AGE: 15.3 wks.days
Down Syndrome Scr Risk Est: 1:1270 {titer}
HCG, Total: 33539 m[IU]/mL
INH: 443 pg/mL
INTERPRETATION-AFP: NEGATIVE
MOM FOR AFP: 1.03
MoM for INH: 3.04
MoM for hCG: 1.65
Open Spina bifida: NEGATIVE
Osb Risk: 1:24300 {titer}
TRI 18 SCR RISK EST: NEGATIVE
Trisomy 18 (Edward) Syndrome Interp.: <1:69300 {titer}
UE3 VALUE: 0.5 ng/mL
uE3 Mom: 1.64

## 2013-09-13 ENCOUNTER — Encounter: Payer: BC Managed Care – PPO | Admitting: Advanced Practice Midwife

## 2013-09-15 ENCOUNTER — Encounter (HOSPITAL_COMMUNITY): Payer: Self-pay

## 2013-09-15 ENCOUNTER — Inpatient Hospital Stay (HOSPITAL_COMMUNITY)
Admission: AD | Admit: 2013-09-15 | Discharge: 2013-09-15 | Disposition: A | Discharge status: Home / Self Care | Payer: Medicaid Other | Source: Ambulatory Visit | Attending: Obstetrics | Admitting: Obstetrics

## 2013-09-15 DIAGNOSIS — R1031 Right lower quadrant pain: Secondary | ICD-10-CM | POA: Insufficient documentation

## 2013-09-15 DIAGNOSIS — N949 Unspecified condition associated with female genital organs and menstrual cycle: Secondary | ICD-10-CM

## 2013-09-15 DIAGNOSIS — O9989 Other specified diseases and conditions complicating pregnancy, childbirth and the puerperium: Principal | ICD-10-CM

## 2013-09-15 DIAGNOSIS — O99891 Other specified diseases and conditions complicating pregnancy: Secondary | ICD-10-CM | POA: Insufficient documentation

## 2013-09-15 LAB — WET PREP, GENITAL
Clue Cells Wet Prep HPF POC: NONE SEEN
Trich, Wet Prep: NONE SEEN
YEAST WET PREP: NONE SEEN

## 2013-09-15 LAB — URINALYSIS, ROUTINE W REFLEX MICROSCOPIC
Bilirubin Urine: NEGATIVE
GLUCOSE, UA: NEGATIVE mg/dL
Ketones, ur: NEGATIVE mg/dL
Leukocytes, UA: NEGATIVE
Nitrite: NEGATIVE
PH: 6.5 (ref 5.0–8.0)
Protein, ur: NEGATIVE mg/dL
SPECIFIC GRAVITY, URINE: 1.02 (ref 1.005–1.030)
Urobilinogen, UA: 0.2 mg/dL (ref 0.0–1.0)

## 2013-09-15 LAB — CBC
HCT: 34.7 % — ABNORMAL LOW (ref 36.0–46.0)
HEMOGLOBIN: 11.9 g/dL — AB (ref 12.0–15.0)
MCH: 31.2 pg (ref 26.0–34.0)
MCHC: 34.3 g/dL (ref 30.0–36.0)
MCV: 91.1 fL (ref 78.0–100.0)
Platelets: 305 10*3/uL (ref 150–400)
RBC: 3.81 MIL/uL — AB (ref 3.87–5.11)
RDW: 13.3 % (ref 11.5–15.5)
WBC: 10.5 10*3/uL (ref 4.0–10.5)

## 2013-09-15 LAB — URINE MICROSCOPIC-ADD ON

## 2013-09-15 MED ORDER — ACETAMINOPHEN 500 MG PO TABS
1000.0000 mg | ORAL_TABLET | Freq: Once | ORAL | Status: AC
  Administered 2013-09-15: 1000 mg via ORAL
  Filled 2013-09-15: qty 2

## 2013-09-15 NOTE — Discharge Instructions (Signed)
Abdominal Pain During Pregnancy  Belly (abdominal) pain is common during pregnancy. Most of the time, it is not a serious problem. Other times, it can be a sign that something is wrong with the pregnancy. Always tell your doctor if you have belly pain.  HOME CARE  Monitor your belly pain for any changes. The following actions may help you feel better:  · Do not have sex (intercourse) or put anything in your vagina until you feel better.  · Rest until your pain stops.  · Drink clear fluids if you feel sick to your stomach (nauseous). Do not eat solid food until you feel better.  · Only take medicine as told by your doctor.  · Keep all doctor visits as told.  GET HELP RIGHT AWAY IF:   · You are bleeding, leaking fluid, or pieces of tissue come out of your vagina.  · You have more pain or cramping.  · You keep throwing up (vomiting).  · You have pain when you pee (urinate) or have blood in your pee.  · You have a fever.  · You do not feel your baby moving as much.  · You feel very weak or feel like passing out.  · You have trouble breathing, with or without belly pain.  · You have a very bad headache and belly pain.  · You have fluid leaking from your vagina and belly pain.  · You keep having watery poop (diarrhea).  · Your belly pain does not go away after resting, or the pain gets worse.  MAKE SURE YOU:   · Understand these instructions.  · Will watch your condition.  · Will get help right away if you are not doing well or get worse.  Document Released: 05/21/2009 Document Revised: 02/02/2013 Document Reviewed: 12/30/2012  [employer]® Patient Information ©2014 [employer].

## 2013-09-15 NOTE — MAU Note (Signed)
Pt c/o right lower abdominal pain times 2 days. States that tonight it got much worse. Says that it feels like its in her ovaries and hurts most when walking but also hurts when she's lying and sitting. Denies vaginal bleeding/discharge. Has not taken any pain medication but used a warm compress and states that it helped some.

## 2013-09-15 NOTE — MAU Provider Note (Signed)
History     CSN: [id number]  Arrival date and time: 09/15/13 0234   First Provider Initiated Contact with Patient 09/15/13 [phone]      Chief Complaint  Patient presents with  . Abdominal Pain   HPI Ms. Jenna Bonilla is a 28 y.o. G1P0 at [redacted]w[redacted]d who presents to MAU today with complaint of right sided suprapubic pain x 2 days. The patient states that the pain has been worsening since onset. She states that it comes and goes and is sometimes relieved by a warm compress. She has not taken any pain medication. She denies vaginal bleeding. She states a "normal" mucus clear discharge. She has had N/V/D yesterday, but none today. She states frequent N/V/D throughout the pregnancy. She also states a fever of 100.44F 2 days ago resolved with Tylenol. She denies UTI symptoms.   OB History   Grav Para Term Preterm Abortions TAB SAB Ect Mult Living   1               Past Medical History  Diagnosis Date  . Asthma     Past Surgical History  Procedure Laterality Date  . Cholecystectomy      Family History  Problem Relation Age of Onset  . Diabetes Mother   . Heart disease Mother   . Cancer Father   . Diabetes Sister   . Diabetes Maternal Aunt   . Cancer Paternal Uncle   . Cancer Paternal Grandmother     History  Substance Use Topics  . Smoking status: Never Smoker   . Smokeless tobacco: Not on file  . Alcohol Use: No    Allergies:  Allergies  Allergen Reactions  . Penicillins Hives and Shortness Of Breath  . Tomato Hives and Shortness Of Breath    Prescriptions prior to admission  Medication Sig Dispense Refill  . acetaminophen (TYLENOL) 325 MG tablet Take 650 mg by mouth every 6 (six) hours as needed for moderate pain.      . butalbital-acetaminophen-caffeine (FIORICET) 50-325-40 MG per tablet Take 2 tablets by mouth every 6 (six) hours as needed for headache.  40 tablet  2  . levothyroxine (SYNTHROID) 125 MCG tablet Take 1 tablet (125 mcg total) by mouth daily  before breakfast.  40 tablet  1  . loratadine (CLARITIN) 10 MG tablet Take 1 tablet (10 mg total) by mouth daily.  30 tablet  11  . omeprazole (PRILOSEC) 20 MG capsule Take 1 capsule (20 mg total) by mouth 2 (two) times daily before a meal.  60 capsule  5  . Prenatal Vit-Fe Fumarate-FA (PRENATAL COMPLETE) 14-0.4 MG TABS Take 1 tablet by mouth daily.  60 each  10  . albuterol (PROVENTIL HFA;VENTOLIN HFA) 108 (90 BASE) MCG/ACT inhaler Inhale 2 puffs into the lungs 2 (two) times daily as needed for wheezing or shortness of breath.         Review of Systems  Constitutional: Positive for fever. Negative for malaise/fatigue.  Gastrointestinal: Positive for nausea, vomiting, abdominal pain and diarrhea. Negative for constipation.  Genitourinary: Negative for dysuria, urgency and frequency.       Neg - vaginal bleeding + vaginal discharge   Physical Exam   Blood pressure 106/69, pulse 93, temperature 99.5 F (37.5 C), temperature source Oral, resp. rate 18, height 5\' 5"  (1.651 m), weight 119.432 kg (263 lb 4.8 oz), last menstrual period 05/23/2013.  Physical Exam  Constitutional: She is oriented to person, place, and time. She appears well-developed and well-nourished.  HENT:  Head: Normocephalic and atraumatic.  Cardiovascular: Normal rate.   Respiratory: Effort normal.  GI: Soft. Bowel sounds are normal. She exhibits no distension and no mass. There is tenderness (moderate tenderness to palpation of the suprapubic region more prominent just right of midline). There is no rebound and no guarding.  Genitourinary: Uterus is enlarged (exam limited by maternal body habitus). Uterus is not tender. Cervix exhibits no motion tenderness, no discharge and no friability. Right adnexum displays tenderness. Right adnexum displays no mass. Left adnexum displays no mass and no tenderness. No bleeding around the vagina. Vaginal discharge (moderate amount of mucus discharge noted) found.  Neurological: She is  alert and oriented to person, place, and time.  Skin: Skin is warm and dry. No erythema.  Psychiatric: She has a normal mood and affect.  Cervix: closed, thick, posterior  Results for orders placed during the hospital encounter of 09/15/13 (from the past 24 hour(s))  URINALYSIS, ROUTINE W REFLEX MICROSCOPIC     Status: Abnormal   Collection Time    09/15/13  2:38 AM      Result Value Ref Range   Color, Urine YELLOW  YELLOW   APPearance CLEAR  CLEAR   Specific Gravity, Urine 1.020  1.005 - 1.030   pH 6.5  5.0 - 8.0   Glucose, UA NEGATIVE  NEGATIVE mg/dL   Hgb urine dipstick TRACE (*) NEGATIVE   Bilirubin Urine NEGATIVE  NEGATIVE   Ketones, ur NEGATIVE  NEGATIVE mg/dL   Protein, ur NEGATIVE  NEGATIVE mg/dL   Urobilinogen, UA 0.2  0.0 - 1.0 mg/dL   Nitrite NEGATIVE  NEGATIVE   Leukocytes, UA NEGATIVE  NEGATIVE  URINE MICROSCOPIC-ADD ON     Status: Abnormal   Collection Time    09/15/13  2:38 AM      Result Value Ref Range   Squamous Epithelial / LPF RARE  RARE   WBC, UA 0-2  <3 WBC/hpf   RBC / HPF 0-2  <3 RBC/hpf   Bacteria, UA FEW (*) RARE  WET PREP, GENITAL     Status: Abnormal   Collection Time    09/15/13  3:27 AM      Result Value Ref Range   Yeast Wet Prep HPF POC NONE SEEN  NONE SEEN   Trich, Wet Prep NONE SEEN  NONE SEEN   Clue Cells Wet Prep HPF POC NONE SEEN  NONE SEEN   WBC, Wet Prep HPF POC MODERATE (*) NONE SEEN  CBC     Status: Abnormal   Collection Time    09/15/13  3:40 AM      Result Value Ref Range   WBC 10.5  4.0 - 10.5 K/uL   RBC 3.81 (*) 3.87 - 5.11 MIL/uL   Hemoglobin 11.9 (*) 12.0 - 15.0 g/dL   HCT 16.1 (*) 09.6 - 04.5 %   MCV 91.1  78.0 - 100.0 fL   MCH 31.2  26.0 - 34.0 pg   MCHC 34.3  30.0 - 36.0 g/dL   RDW 40.9  81.1 - 91.4 %   Platelets 305  150 - 400 K/uL    MAU Course  Procedures None  MDM FHR - 146 bpm with doppler UA, wet prep, GC/Chlamydia and CBC today 1000 mg Tylenol given Patient reports some improvement in pain Assessment  and Plan  A: Round ligament pain  P: Discharge home Advised Tylenol PRN for pain Discussed warm bath/shower, abdominal binder and moderate of activity Patient advised to follow-up with Dr. Tamela Oddi as  scheduled for routine prenatal care Patient may return to MAU as needed or if her condition were to change or worsen  Freddi StarrJulie N Ethier, PA-C  09/15/2013, 4:24 AM

## 2013-09-16 LAB — GC/CHLAMYDIA PROBE AMP
CT PROBE, AMP APTIMA: NEGATIVE
GC Probe RNA: NEGATIVE

## 2013-10-05 ENCOUNTER — Ambulatory Visit (INDEPENDENT_AMBULATORY_CARE_PROVIDER_SITE_OTHER): Payer: BC Managed Care – PPO

## 2013-10-05 ENCOUNTER — Encounter: Payer: BC Managed Care – PPO | Admitting: Obstetrics

## 2013-10-05 ENCOUNTER — Other Ambulatory Visit: Payer: Self-pay | Admitting: Obstetrics

## 2013-10-05 DIAGNOSIS — Z3400 Encounter for supervision of normal first pregnancy, unspecified trimester: Secondary | ICD-10-CM

## 2013-10-05 DIAGNOSIS — Z1389 Encounter for screening for other disorder: Secondary | ICD-10-CM

## 2013-10-06 ENCOUNTER — Encounter: Payer: Self-pay | Admitting: Obstetrics

## 2013-10-06 ENCOUNTER — Encounter: Payer: BC Managed Care – PPO | Admitting: Obstetrics

## 2013-10-06 LAB — US OB COMP + 14 WK

## 2013-10-07 ENCOUNTER — Ambulatory Visit (INDEPENDENT_AMBULATORY_CARE_PROVIDER_SITE_OTHER): Payer: BC Managed Care – PPO | Admitting: Obstetrics

## 2013-10-07 ENCOUNTER — Encounter: Payer: Self-pay | Admitting: Obstetrics

## 2013-10-07 VITALS — BP 117/76 | HR 105 | Temp 99.1°F | Wt 267.0 lb

## 2013-10-07 DIAGNOSIS — Z3400 Encounter for supervision of normal first pregnancy, unspecified trimester: Secondary | ICD-10-CM

## 2013-10-07 LAB — POCT URINALYSIS DIPSTICK
Bilirubin, UA: NEGATIVE
Blood, UA: NEGATIVE
Ketones, UA: NEGATIVE
Leukocytes, UA: NEGATIVE
Nitrite, UA: NEGATIVE
SPEC GRAV UA: 1.02
Urobilinogen, UA: NEGATIVE
pH, UA: 5

## 2013-10-07 NOTE — Progress Notes (Signed)
Subjective:    Jenna Bonilla is a 28 y.o. female being seen today for her obstetrical visit. She is at 5852w4d gestation. Patient reports: no complaints . Fetal movement: normal.  Past Medical History  Diagnosis Date  . Asthma     Past Surgical History  Procedure Laterality Date  . Cholecystectomy      Current outpatient prescriptions:acetaminophen (TYLENOL) 325 MG tablet, Take 650 mg by mouth every 6 (six) hours as needed for moderate pain., Disp: , Rfl: ;  albuterol (PROVENTIL HFA;VENTOLIN HFA) 108 (90 BASE) MCG/ACT inhaler, Inhale 2 puffs into the lungs 2 (two) times daily as needed for wheezing or shortness of breath. , Disp: , Rfl:  butalbital-acetaminophen-caffeine (FIORICET) 50-325-40 MG per tablet, Take 2 tablets by mouth every 6 (six) hours as needed for headache., Disp: 40 tablet, Rfl: 2;  levothyroxine (SYNTHROID) 125 MCG tablet, Take 1 tablet (125 mcg total) by mouth daily before breakfast., Disp: 40 tablet, Rfl: 1;  loratadine (CLARITIN) 10 MG tablet, Take 1 tablet (10 mg total) by mouth daily., Disp: 30 tablet, Rfl: 11 omeprazole (PRILOSEC) 20 MG capsule, Take 1 capsule (20 mg total) by mouth 2 (two) times daily before a meal., Disp: 60 capsule, Rfl: 5;  Prenatal Vit-Fe Fumarate-FA (PRENATAL COMPLETE) 14-0.4 MG TABS, Take 1 tablet by mouth daily., Disp: 60 each, Rfl: 10 Allergies  Allergen Reactions  . Penicillins Hives and Shortness Of Breath  . Tomato Hives and Shortness Of Breath    History  Substance Use Topics  . Smoking status: Never Smoker   . Smokeless tobacco: Not on file  . Alcohol Use: No    Family History  Problem Relation Age of Onset  . Diabetes Mother   . Heart disease Mother   . Cancer Father   . Diabetes Sister   . Diabetes Maternal Aunt   . Cancer Paternal Uncle   . Cancer Paternal Grandmother      Review of Systems Constitutional: negative for anorexia Gastrointestinal: negative for abdominal pain Genitourinary:negative for vaginal  discharge Musculoskeletal:negative for back pain Behavioral/Psych: negative for depression and tobacco use  Objective:    BP 117/76  Pulse 105  Temp(Src) 99.1 F (37.3 C)  Wt 267 lb (121.11 kg)  LMP 05/23/2013 FHT: 150 BPM  Uterine Size: size equals dates     Assessment:    Pregnancy @ 2952w4d    Plan:    [place] [employer]  Labs, problem list reviewed and updated 2 hr GTT planned Follow up in 4 weeks.

## 2014-04-17 ENCOUNTER — Encounter: Payer: Self-pay | Admitting: Obstetrics

## 2014-05-09 ENCOUNTER — Encounter (HOSPITAL_COMMUNITY): Payer: Self-pay | Admitting: *Deleted

## 2015-04-13 IMAGING — US US OB TRANSVAGINAL
1 series · 14 of 28 positions shown · non-contrast
Comparison: None.

CLINICAL DATA: Vaginal bleeding.  Beta HCG level, [DATE].

EXAM:
OBSTETRIC <14 WK US AND TRANSVAGINAL OB US
TECHNIQUE: Both transabdominal and transvaginal ultrasound examinations were
performed for complete evaluation of the gestation as well as the
maternal uterus, adnexal regions, and pelvic cul-de-sac.
Transvaginal technique was performed to assess early pregnancy.

[Series 1: us ob transvaginal · 36 acquisitions, 14 frames shown]
[im 2/36]
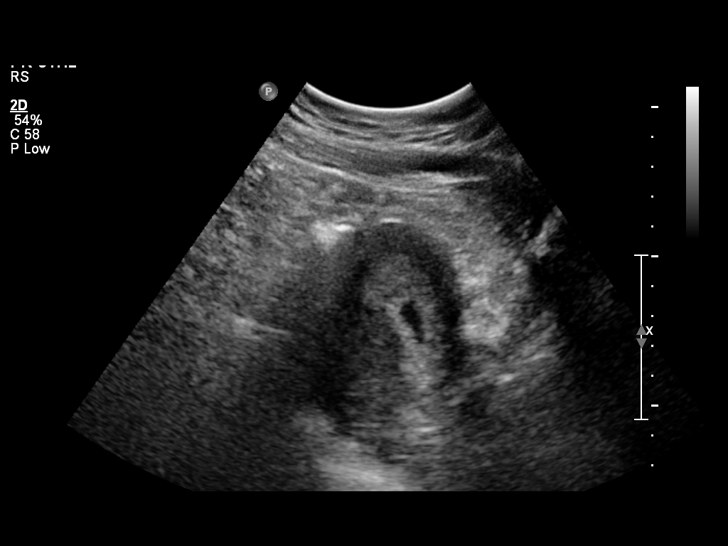
[im 4/36]
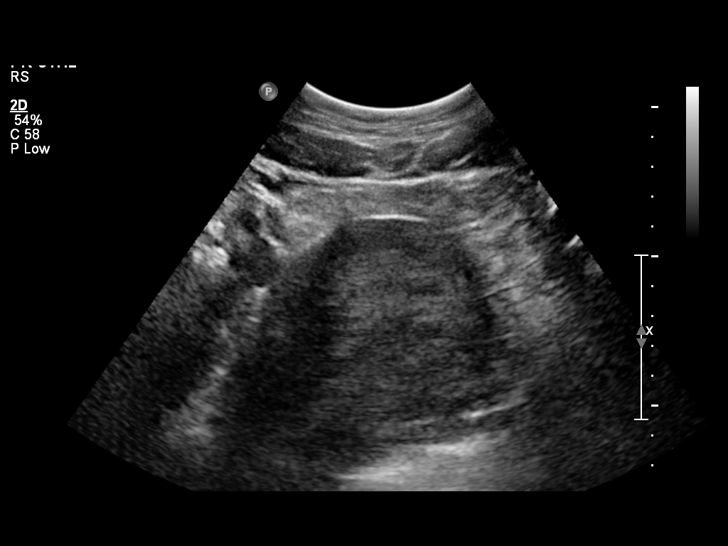
[im 7/36]
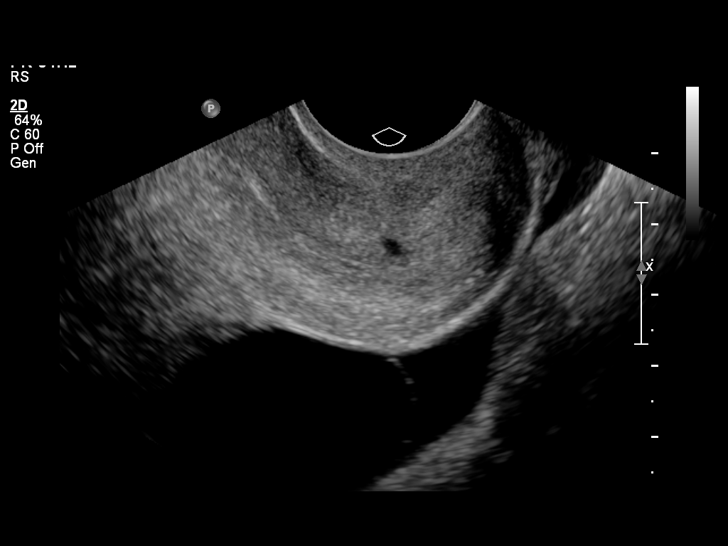
[im 10/36]
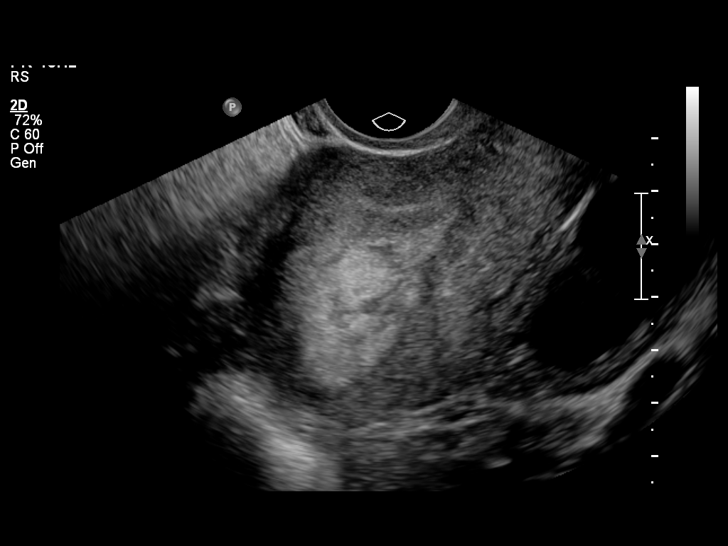
[im 12/36]
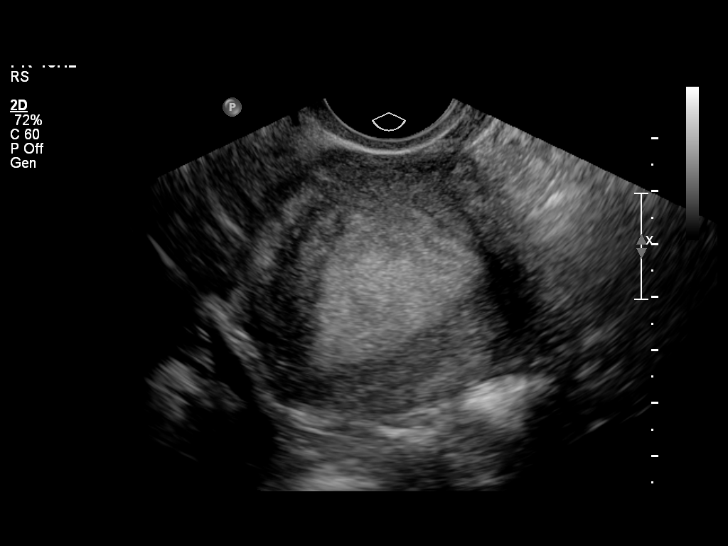
[im 15/36]
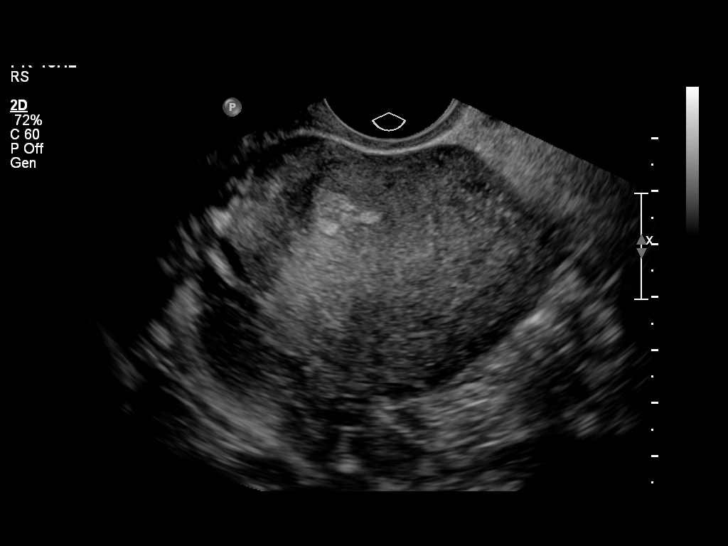
[im 17/36]
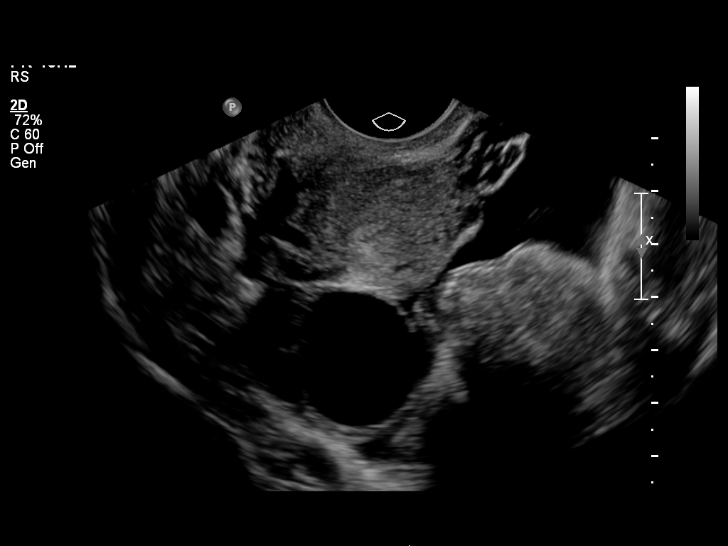
[im 20/36]
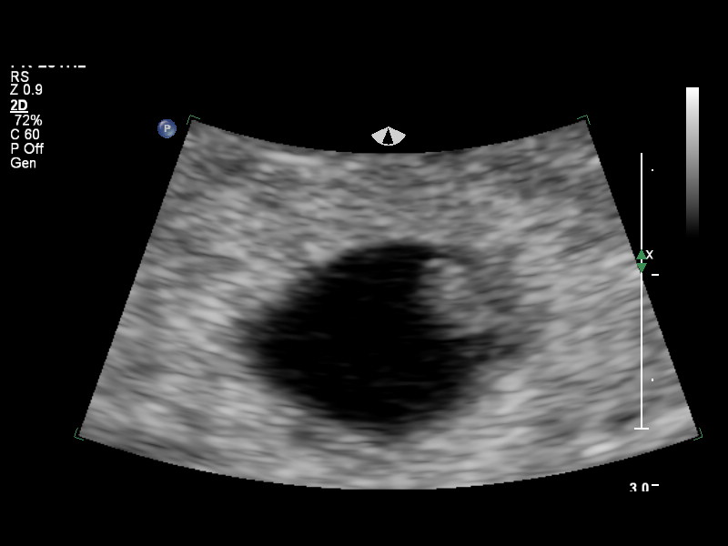
[im 23/36]
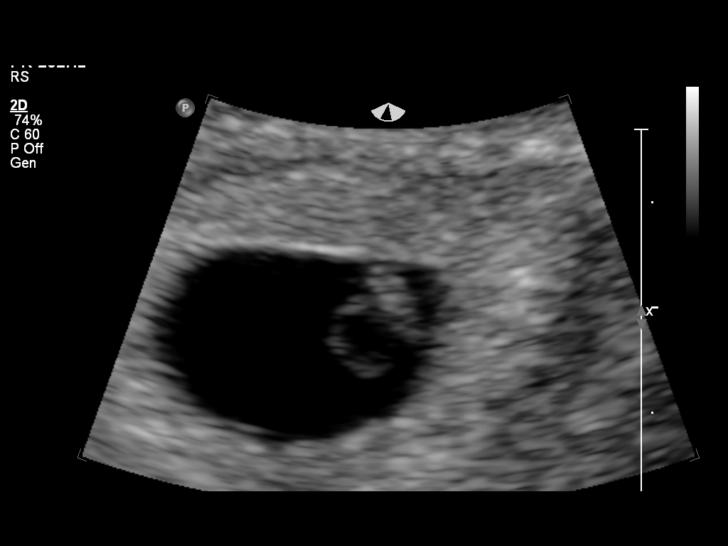
[im 25/36]
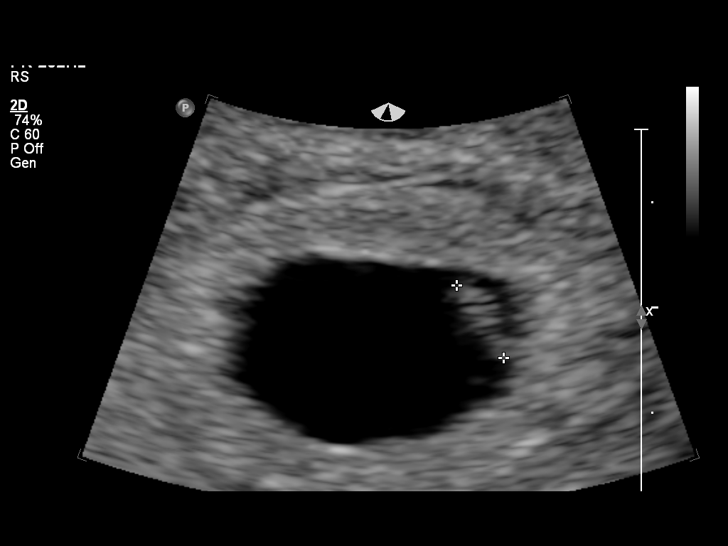
[im 28/36]
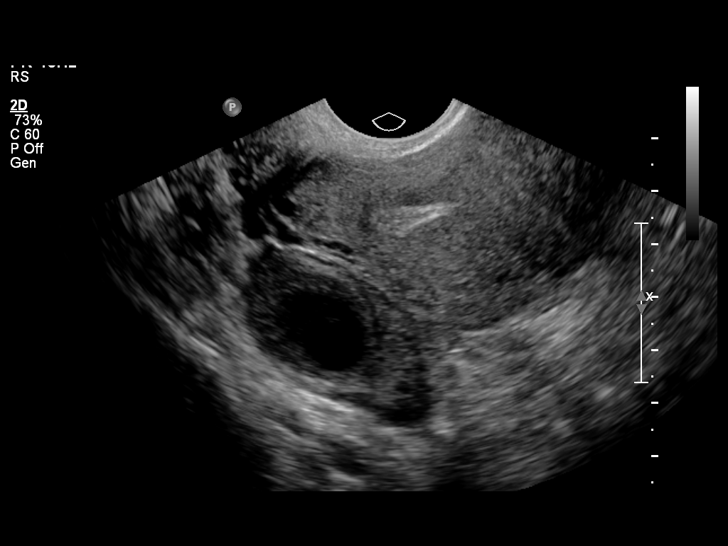
[im 30/36]
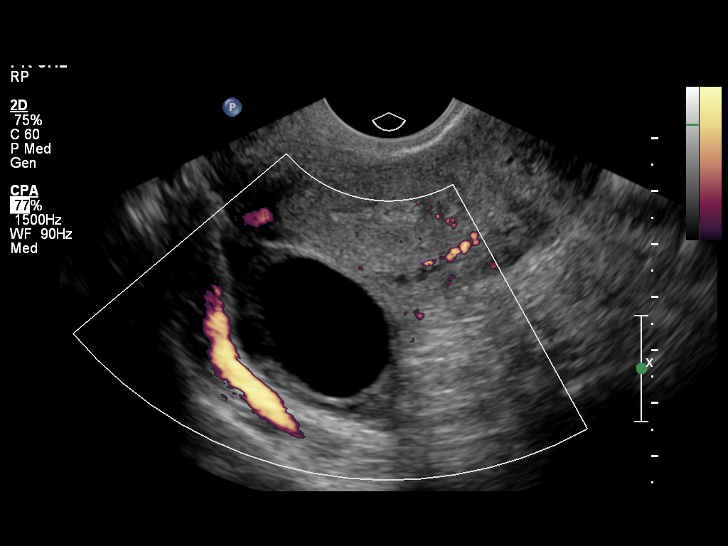
[im 33/36]
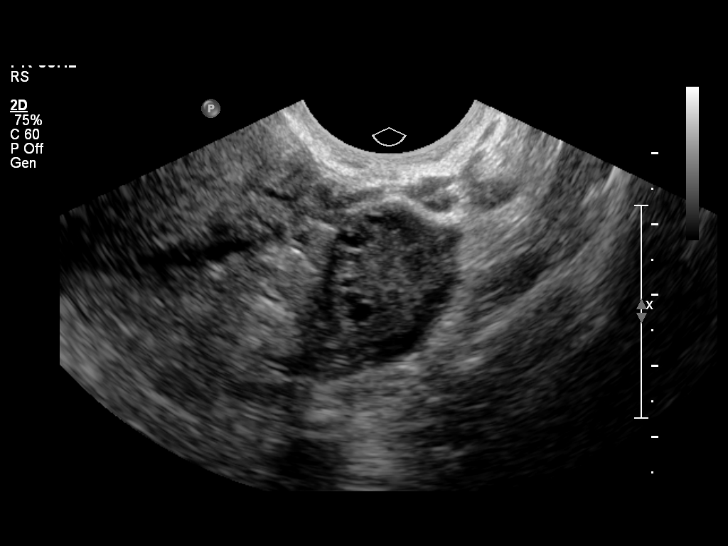
[im 36/36]
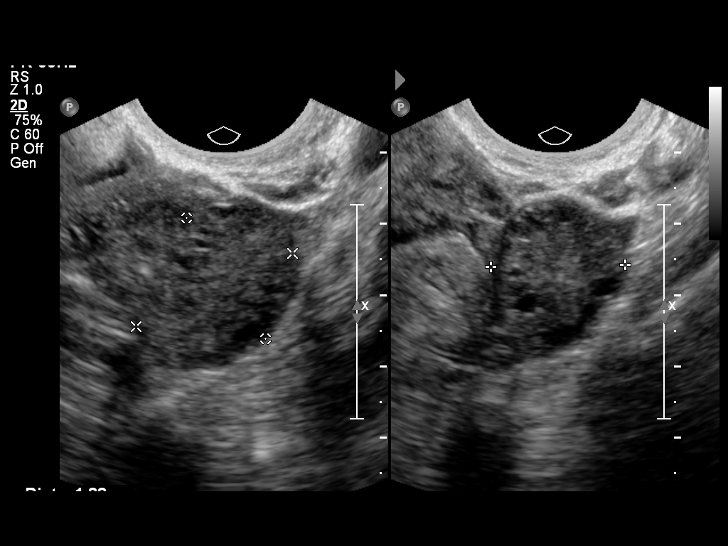

[14 of 28 positions shown; findings below may reference images not displayed]

FINDINGS: Intrauterine gestational sac: Visualized/normal in shape.

Yolk sac:  Yes

Embryo:  Small embryo visualized

Cardiac Activity: Yes

Heart Rate:  113 bpm

CRL:   4.1 mm  mm   6 w 1 d                  US EDC: 02/26/2014

Maternal uterus/adnexae: Normal uterus. No separate hemorrhage. Two
right ovarian cysts, 1 a simple appearing follicular cyst the other
with a thicker wall likely the corpus luteum. Ovaries are otherwise
unremarkable. No adnexal masses or free fluid.
IMPRESSION: 1. Single live intrauterine pregnancy. No emergent complication to
the embryo or uterus.
2. Two left ovarian cysts, within the normal physiologic size range.
No adnexal masses or free fluid.
